# Patient Record
Sex: Female | Born: 1975 | Hispanic: No | Marital: Married
Health system: Midwestern US, Community
[De-identification: ages and names within clinical notes are randomized; demographics above are authoritative.]

---

## 2011-09-25 IMAGING — CR DG ABDOMEN 2V
3 series · 3 of 3 positions shown · non-contrast
Comparison: None

CLINICAL DATA: Abdominal pain, diarrhea, nausea, vomiting, fever

ABDOMEN - 2 VIEW

[w abdomen upright]
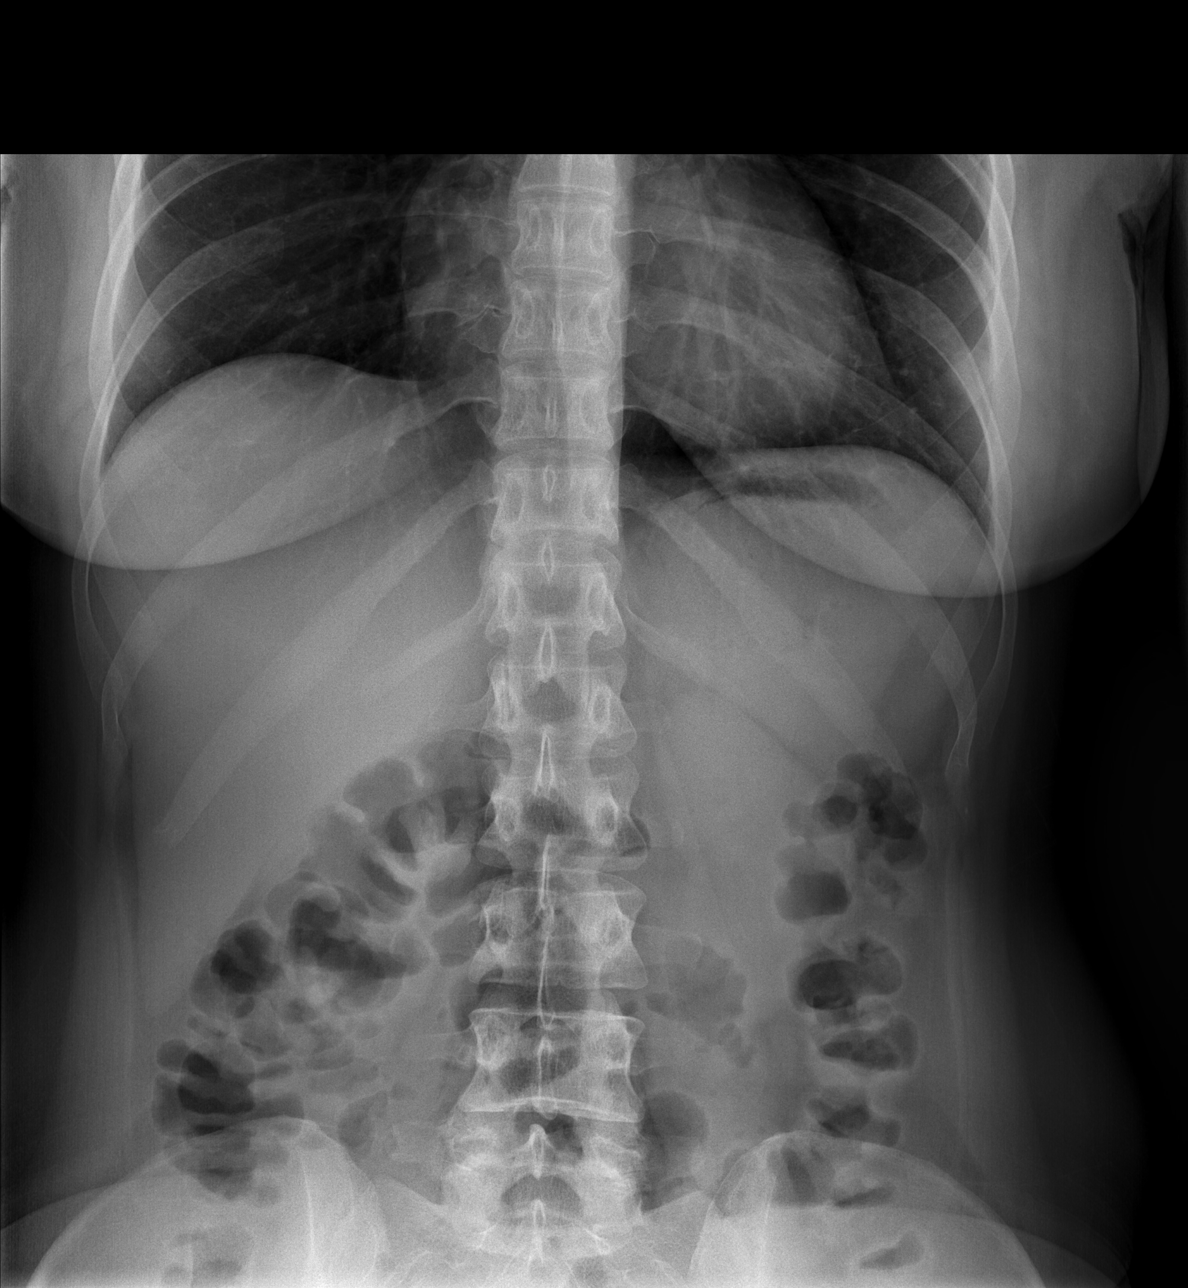

[t abdomen supine (1 of 2)]
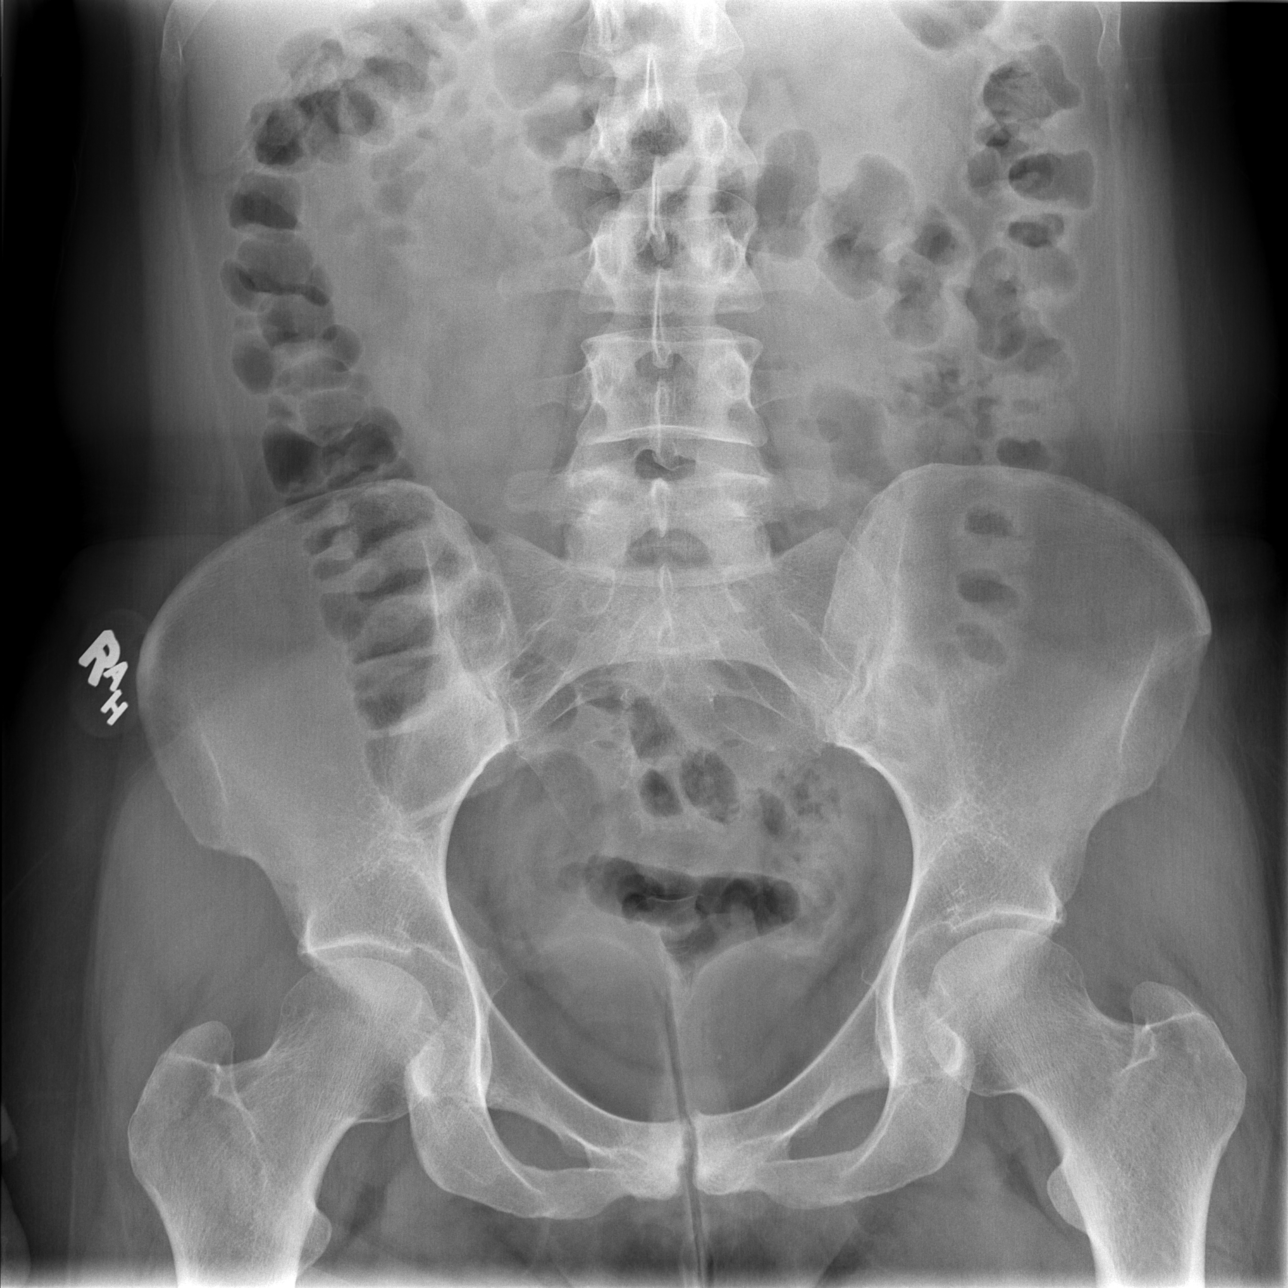

[t abdomen supine (2 of 2)]
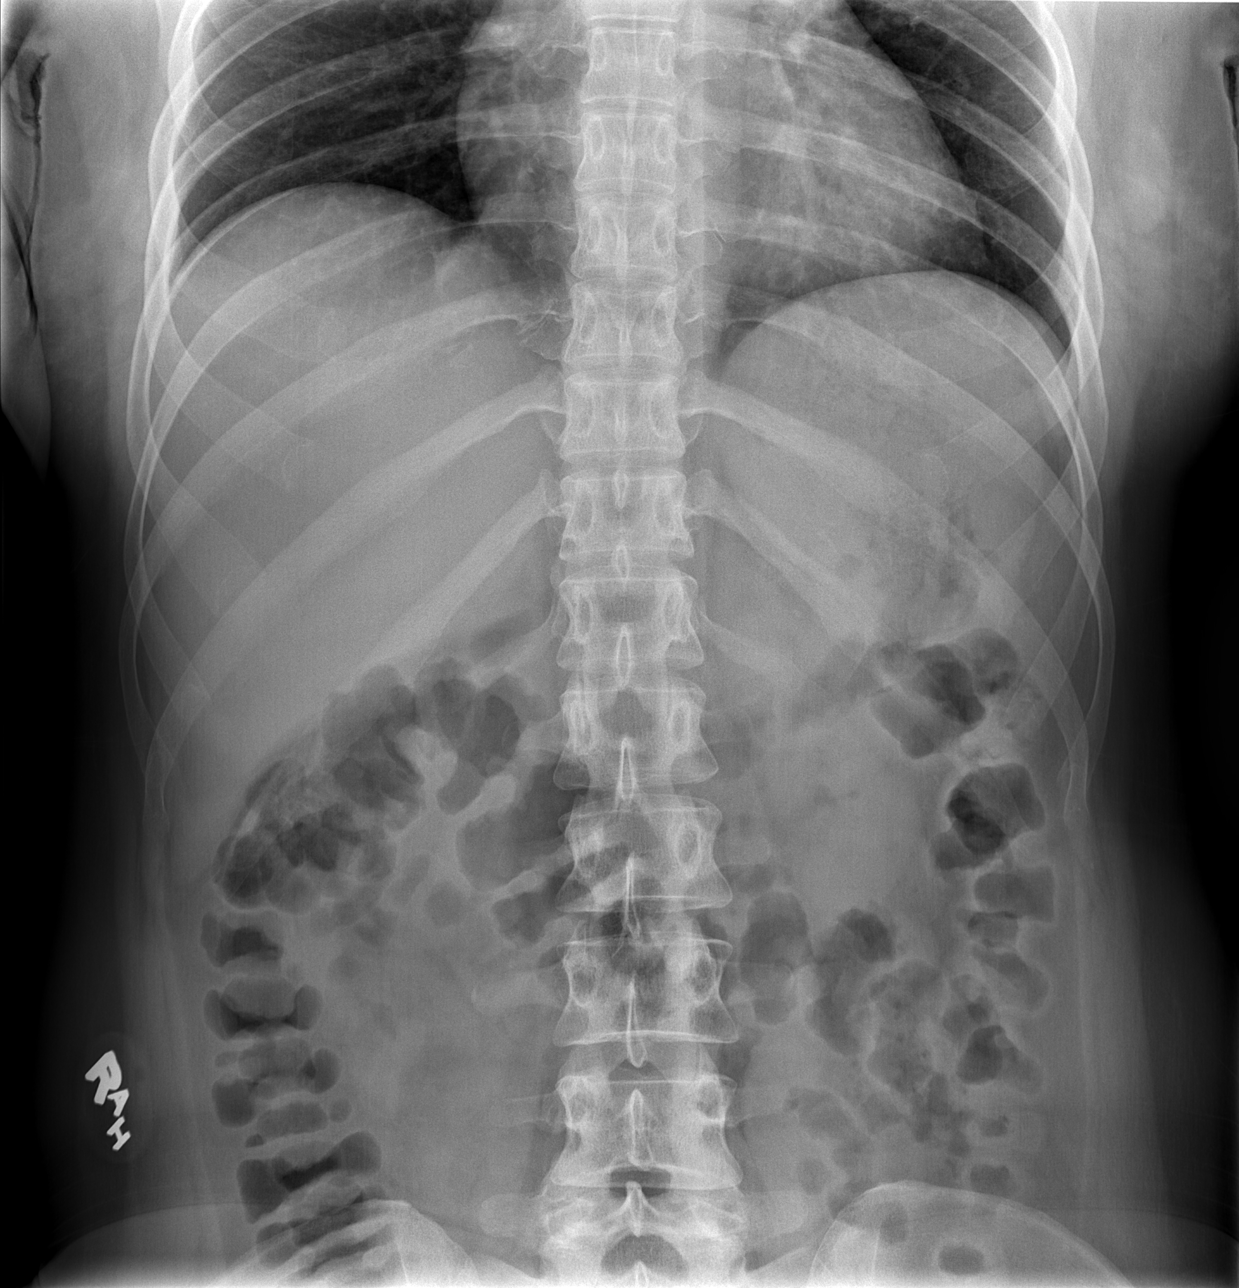

[3 of 3 positions shown; findings below may reference images not displayed]

FINDINGS: Normal bowel gas pattern.
No bowel dilatation or bowel wall thickening.
No free intraperitoneal air.
Lung bases clear.
Osseous structures unremarkable.
No urinary tract calcification.
IMPRESSION: No acute abnormalities.

## 2017-09-06 ENCOUNTER — Emergency Department: Admit: 2017-09-06 | Payer: TRICARE (CHAMPUS) | Primary: Internal Medicine

## 2017-09-06 ENCOUNTER — Inpatient Hospital Stay
Admit: 2017-09-06 | Discharge: 2017-09-07 | Disposition: A | Discharge status: Home / Self Care | Payer: TRICARE (CHAMPUS) | Attending: Emergency Medicine

## 2017-09-06 DIAGNOSIS — B159 Hepatitis A without hepatic coma: Secondary | ICD-10-CM

## 2017-09-06 LAB — EKG 12-LEAD
Atrial Rate: 85 {beats}/min
P Axis: 7 degrees
P-R Interval: 110 ms
Q-T Interval: 366 ms
QRS Duration: 88 ms
QTc Calculation (Bazett): 435 ms
R Axis: 7 degrees
T Axis: 17 degrees
Ventricular Rate: 85 {beats}/min

## 2017-09-06 LAB — METABOLIC PANEL, COMPREHENSIVE
ALT (SGPT): 5641 U/L — ABNORMAL HIGH (ref 12–78)
AST (SGOT): 4079 U/L — ABNORMAL HIGH (ref 15–37)
Albumin: 3.6 gm/dl (ref 3.4–5.0)
Alk. phosphatase: 314 U/L — ABNORMAL HIGH (ref 45–117)
Anion gap: 6 mmol/L (ref 5–15)
BUN: 10 mg/dl (ref 7–25)
Bilirubin, total: 3.6 mg/dl — ABNORMAL HIGH (ref 0.2–1.0)
CO2: 28 mEq/L (ref 21–32)
Calcium: 9.3 mg/dl (ref 8.5–10.1)
Chloride: 105 mEq/L (ref 98–107)
Creatinine: 0.8 mg/dl (ref 0.6–1.3)
GFR est AA: >60
GFR est non-AA: >60
Glucose: 87 mg/dl (ref 74–106)
Potassium: 4 mEq/L (ref 3.5–5.1)
Protein, total: 8 gm/dl (ref 6.4–8.2)
Sodium: 139 mEq/L (ref 136–145)

## 2017-09-06 LAB — HEPATITIS PANEL, ACUTE
Hepatitis A, IgM: REACTIVE — AB
Hepatitis B core, IgM: NONREACTIVE
Hepatitis B surface Ag: NONREACTIVE
Hepatitis C virus Ab: NONREACTIVE
Signal to Cutoff (Hep C): 0

## 2017-09-06 LAB — EKG, 12 LEAD, INITIAL
Atrial Rate: 85 {beats}/min
Calculated P Axis: 7 degrees
Calculated R Axis: 7 degrees
Calculated T Axis: 17 degrees
P-R Interval: 110 ms
Q-T Interval: 366 ms
QRS Duration: 88 ms
QTC Calculation (Bezet): 435 ms
Ventricular Rate: 85 {beats}/min

## 2017-09-06 LAB — URINALYSIS W/ RFLX MICROSCOPIC
Glucose: 100 mg/dl — AB
Nitrites: POSITIVE — AB
Protein: 100 mg/dl — AB
Specific gravity: 1.02 (ref 1.005–1.030)
Urobilinogen: 2 mg/dl — ABNORMAL HIGH (ref 0.0–1.0)
pH (UA): 6.5 (ref 5.0–9.0)

## 2017-09-06 LAB — POC URINE MICROSCOPIC: Epithelial cells, squamous: >50 /LPF

## 2017-09-06 LAB — CBC WITH AUTOMATED DIFF
ATYPICAL LYMPHS: 7.5 % — ABNORMAL HIGH (ref 0–0)
BAND NEUTROPHILS: 1.9 % (ref 0–11)
BASOPHILS: 0 % (ref 0–3)
EOSINOPHILS: 2.8 % (ref 0–5)
HCT: 41.8 % (ref 37.0–50.0)
HGB: 13.3 gm/dl (ref 13.0–17.2)
IMMATURE GRANULOCYTES: 0.5 % (ref 0.0–3.0)
LYMPHOCYTES: 37.7 % (ref 28–48)
MCH: 29.8 pg (ref 25.4–34.6)
MCHC: 31.8 gm/dl (ref 30.0–36.0)
MCV: 93.7 fL (ref 80.0–98.0)
MONOCYTES: 7.6 % (ref 1–13)
MPV: 10.6 fL — ABNORMAL HIGH (ref 6.0–10.0)
NEUTROPHILS: 38.7 % (ref 34–64)
NRBC: 0 (ref 0–0)
PLASMA CELL: 4
PLATELET COMMENTS: NORMAL
PLATELET: 154 10*3/uL (ref 140–450)
RBC: 4.46 M/uL (ref 3.60–5.20)
RDW-SD: 43.3 (ref 36.4–46.3)
Smudge cells: 1.9
WBC: 3.7 10*3/uL — ABNORMAL LOW (ref 4.0–11.0)

## 2017-09-06 LAB — MONONUCLEOSIS SCREEN: Mononucleosis screen: NEGATIVE

## 2017-09-06 LAB — LIPASE: Lipase: 165 U/L (ref 73–393)

## 2017-09-06 LAB — HCG URINE, QL: HCG urine, QL: NEGATIVE

## 2017-09-06 MED ORDER — SODIUM CHLORIDE 0.9% BOLUS IV
0.9 % | INTRAVENOUS | Status: AC
Start: 2017-09-06 — End: 2017-09-06
  Administered 2017-09-06: 20:00:00 via INTRAVENOUS

## 2017-09-06 MED ORDER — ONDANSETRON (PF) 4 MG/2 ML INJECTION
4 mg/2 mL | Freq: Once | INTRAMUSCULAR | Status: AC
Start: 2017-09-06 — End: 2017-09-06
  Administered 2017-09-06: 22:00:00 via INTRAVENOUS

## 2017-09-06 MED FILL — ONDANSETRON (PF) 4 MG/2 ML INJECTION: 4 mg/2 mL | INTRAMUSCULAR | Qty: 2

## 2017-09-06 NOTE — ED Provider Notes (Signed)
[facility]  Emergency Department Treatment Report        Patient: Alyssa Lowe Age: 42 y.o. Sex: female    Date of Birth: 13-Jun-1975 Admit Date: 09/06/2017 PCP: Lacey Jensen, MD   MRN: [id number]  CSN: [id number]  Attending: Maylon Cos, MD   Room: [phone] Time Dictated: 3:55 PM [id number]       Chief Complaint   Fatigue, fever vomiting and diarrhea    History of Present Illness   42 y.o. female states that she started feeling ill with fatigue and fever 6 days ago.  She had gone to an urgent care facility and had a negative strep test and negative flu swab.  She been having some frontal headache which preceded the fever and started about 10 days ago.  The headache was bilateral behind her eyes and since she had a fever she was placed on Augmentin for possible sinus infection.  She states she continued running fever the following day and then started having some nausea vomiting and diarrhea which she thought was related to the antibiotics.  She stopped the Augmentin 3 days ago but she still having nausea and vomiting and she was feeling worse.  States that she vomited 3 times yesterday as well as vomited the day before but none today.  Her urine is very dark and she continues to have generalized fatigue and that is why she came in today.  She admits to some fatty food intolerance that is been going on for a while but denies any abdominal pain.  She is no longer having the headache.    Review of Systems   Review of Systems   Constitutional: Positive for chills, fever and malaise/fatigue.   HENT: Negative for congestion and sore throat.    Eyes: Negative for discharge and redness.   Respiratory: Negative for cough and wheezing.    Cardiovascular: Negative for chest pain and leg swelling.   Gastrointestinal: Positive for diarrhea, nausea and vomiting. Negative for abdominal pain.   Genitourinary: Negative for dysuria.        Dark urine   Musculoskeletal:        Slight low backache    Skin: Negative for rash.   Neurological: Negative for focal weakness and loss of consciousness.       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  Had elevation in LFTs at age 55 after an intentional Tylenol overdose but was admitted and treated for the overdose.  -Hypothyroidism  No current depression suicidal ideation    Social History   Occasional EtOH on weekends but denies frequent EtOH    Denies taking frequent Tylenol.  States she is just taken to Tylenol over the last few days for the fever    Denies recent travel, not eating any raw seafood or drinking from any streams  Family History   No family history of biliary disease    Current Medications     None       Allergies   No Known Allergies    Physical Exam     ED Triage Vitals [09/06/17 1437]   ED Encounter Vitals Group      BP 110/80      Pulse (Heart Rate) 91      Resp Rate 16      Temp 98.2 ??F (36.8 ??C)      Temp src       O2 Sat (%) 98 %      Weight  169 lb 15.6 oz      Height '5\' 8"'      Physical Exam   Constitutional: She is oriented to person, place, and time.   General appearance: Patient appears well-developed and well-nourished.   HENT:   Head: Normocephalic and atraumatic.   Mouth/Throat: Oropharynx is clear and moist.   Eyes: Pupils are equal, round, and reactive to light. Conjunctivae are normal. No scleral icterus.   Neck: Neck supple. No thyromegaly present.   Cardiovascular: Normal rate, regular rhythm and normal heart sounds.   No murmur heard.  Pulmonary/Chest: Effort normal. No respiratory distress. She has no wheezes.   Abdominal: Soft.   Tenderness right upper quadrant, no guarding or rebound, no masses palpable   Musculoskeletal: She exhibits no edema.   Lymphadenopathy:     She has no cervical adenopathy.   Neurological: She is alert and oriented to person, place, and time.   Skin: Skin is warm and dry. No rash noted.   Vitals reviewed.       Impression and Management Plan    Patient with symptoms of fatigue vomiting and diarrhea we will obtain labs , she is tender right upper quadrant we will obtain ultrasound to evaluate for biliary etiology of her symptoms especially with her dark urine.    Diagnostic Studies   Lab:   Recent Results (from the past 12 hour(s))   EKG, 12 LEAD, INITIAL    Collection Time: 09/06/17  2:26 PM   Result Value Ref Range    Ventricular Rate 85 BPM    Atrial Rate 85 BPM    P-R Interval 110 ms    QRS Duration 88 ms    Q-T Interval 366 ms    QTC Calculation (Bezet) 435 ms    Calculated P Axis 7 degrees    Calculated R Axis 7 degrees    Calculated T Axis 17 degrees    Diagnosis       Sinus rhythm with short PR  Low voltage QRS  Nonspecific ST abnormality  Abnormal ECG  No previous ECGs available  Confirmed by Truman Hayward, M.D., [facility] ([id number]) on 09/06/2017 7:40:16 PM     CBC WITH AUTOMATED DIFF    Collection Time: 09/06/17  3:00 PM   Result Value Ref Range    WBC 3.7 (L) 4.0 - 11.0 1000/mm3    NEUTROPHILS 38.7 34 - 64 %    LYMPHOCYTES 37.7 28 - 48 %    RBC 4.46 3.60 - 5.20 M/uL    MONOCYTES 7.6 1 - 13 %    HGB 13.3 13.0 - 17.2 gm/dl    EOSINOPHILS 2.8 0 - 5 %    HCT 41.8 37.0 - 50.0 %    MCV 93.7 80.0 - 98.0 fL    BAND NEUTROPHILS 1.9 0 - 11 %    MCH 29.8 25.4 - 34.6 pg    MCHC 31.8 30.0 - 36.0 gm/dl    PLATELET 154 140 - 450 1000/mm3    MPV 10.6 (H) 6.0 - 10.0 fL    ATYPICAL LYMPHS 7.5 (H) 0 - 0 %    RDW-SD 43.3 36.4 - 46.3      NRBC 0 0 - 0      IMMATURE GRANULOCYTES 0.5 0.0 - 3.0 %    BASOPHILS 0.0 0 - 3 %    Smudge cells 1.9      PLASMA CELL 4      Poikilocytosis OCCASIONAL      PLATELET COMMENTS NORMAL  Large platelets OCCASIONAL      Ovalocytes-DIF OCCASIONAL     LIPASE    Collection Time: 09/06/17  3:00 PM   Result Value Ref Range    Lipase 165 73 - 712 U/L   METABOLIC PANEL, COMPREHENSIVE    Collection Time: 09/06/17  3:00 PM   Result Value Ref Range    Sodium 139 136 - 145 mEq/L    Potassium 4.0 3.5 - 5.1 mEq/L    Chloride 105 98 - 107 mEq/L     CO2 28 21 - 32 mEq/L    Glucose 87 74 - 106 mg/dl    BUN 10 7 - 25 mg/dl    Creatinine 0.8 0.6 - 1.3 mg/dl    GFR est AA >60.0      GFR est non-AA >60      Calcium 9.3 8.5 - 10.1 mg/dl    AST (SGOT) 4,079 (H) 15 - 37 U/L    ALT (SGPT) 5,641 (H) 12 - 78 U/L    Alk. phosphatase 314 (H) 45 - 117 U/L    Bilirubin, total 3.6 (H) 0.2 - 1.0 mg/dl    Protein, total 8.0 6.4 - 8.2 gm/dl    Albumin 3.6 3.4 - 5.0 gm/dl    Anion gap 6 5 - 15 mmol/L   MONONUCLEOSIS SCREEN    Collection Time: 09/06/17  4:15 PM   Result Value Ref Range    Mononucleosis screen NEGATIVE NEGATIVE     HEPATITIS PANEL, ACUTE    Collection Time: 09/06/17  4:15 PM   Result Value Ref Range    Hepatitis A, IgM REACTIVE (A) NON-REACTIVE      Hepatitis B core, IgM NON-REACTIVE      Hepatitis B surface Ag NON-REACTIVE NON-REACTIVE      Hepatitis C virus Ab NON-REACTIVE NON-REACTIVE      Signal to Cutoff (Hep C) 0     URINALYSIS W/ RFLX MICROSCOPIC    Collection Time: 09/06/17  4:35 PM   Result Value Ref Range    Color AMBER (A) YELLOW,STRAW      Appearance SLIGHTLY CLOUDY (A) CLEAR      Glucose 100 (A) NEGATIVE,Negative mg/dl    Bilirubin LARGE (A) NEGATIVE,Negative      Ketone TRACE (A) NEGATIVE,Negative mg/dl    Specific gravity 1.020 1.005 - 1.030      Blood TRACE (A) NEGATIVE,Negative      pH (UA) 6.5 5.0 - 9.0      Protein 100 (A) NEGATIVE,Negative mg/dl    Urobilinogen 2.0 (H) 0.0 - 1.0 mg/dl    Nitrites POSITIVE (A) NEGATIVE,Negative      Leukocyte Esterase TRACE (A) NEGATIVE,Negative     POC URINE MICROSCOPIC    Collection Time: 09/06/17  4:35 PM   Result Value Ref Range    Epithelial cells, squamous >50 /LPF    WBC 1-4 /HPF    Bacteria 1+ /HPF    CRYSTALS, CALCIUM OXALATE OCCASIONAL /HPF   HCG URINE, QL    Collection Time: 09/06/17  4:45 PM   Result Value Ref Range    HCG urine, QL NEGATIVE NEGATIVE     PROTHROMBIN TIME + INR    Collection Time: 09/06/17  8:30 PM   Result Value Ref Range    Prothrombin time 13.5 (H) 10.2 - 12.9 seconds     INR 1.2 (H) 0.1 - 1.1       Labs Reviewed   CBC WITH AUTOMATED DIFF - Abnormal; Notable for the following components:  Result Value    WBC 3.7 (*)     MPV 10.6 (*)     ATYPICAL LYMPHS 7.5 (*)     All other components within normal limits   METABOLIC PANEL, COMPREHENSIVE - Abnormal; Notable for the following components:    AST (SGOT) 4,079 (*)     ALT (SGPT) 5,641 (*)     Alk. phosphatase 314 (*)     Bilirubin, total 3.6 (*)     All other components within normal limits   HEPATITIS PANEL, ACUTE - Abnormal; Notable for the following components:    Hepatitis A, IgM REACTIVE (*)     All other components within normal limits   URINALYSIS W/ RFLX MICROSCOPIC - Abnormal; Notable for the following components:    Color AMBER (*)     Appearance SLIGHTLY CLOUDY (*)     Glucose 100 (*)     Bilirubin LARGE (*)     Ketone TRACE (*)     Blood TRACE (*)     Protein 100 (*)     Urobilinogen 2.0 (*)     Nitrites POSITIVE (*)     Leukocyte Esterase TRACE (*)     All other components within normal limits   PROTHROMBIN TIME + INR - Abnormal; Notable for the following components:    Prothrombin time 13.5 (*)     INR 1.2 (*)     All other components within normal limits   CULTURE, BLOOD   CULTURE, BLOOD   CULTURE, URINE   C. DIFFICILE/EPI PCR   CULTURE, STOOL   OVA & PARASITES, STOOL   LIPASE   MONONUCLEOSIS SCREEN   HCG URINE, QL   POC URINE MICROSCOPIC   EKG per ED attending sinus rhythm at a rate of 85 with no ST elevation and some nonspecific ST-T changes per ED attending, no acute ischemic changes.    Imaging:    Xr Chest Pa Lat    Result Date: 09/06/2017  EXAM: Frontal and lateral views of the chest. INDICATIONS: Fever. COMPARISON: None. FINDINGS: No alveolar consolidations, congestive changes or pleural effusions. Cardiomediastinal silhouette normal in size and contour.     IMPRESSION: No acute cardiopulmonary process.     [place] Ruq    Result Date: 09/06/2017  Examination: Ultrasound right upper quadrant. INDICATION: Right upper  quadrant pain. Elevated LFTs. FINDINGS: Liver appears unremarkable. Normal hepatic parenchyma. No focal hepatic lesions. No significant intrahepatic biliary ductal dilatation. CBD within normal limits roughly measuring 0.4 cm. The gallbladder is contracted. There is mild irregular thickening of the gallbladder wall. No significant surrounding inflammatory change. Portions of the gallbladder wall measure up to 0.7 cm in thickness. There is evidence of sonographic Murphy's sign per sonographer. Visualized pancreas grossly unremarkable. Proximal aorta and IVC grossly unremarkable. The right kidney is normal in size and appears unremarkable.     IMPRESSION: 1. The gallbladder is contracted, demonstrating some irregular wall thickening. The thickening of the gallbladder wall may be entirely related to decompression of the gallbladder. No stone evidence of flow within the gallbladder wall to suggest a mass. Recommend clinical correlation. May consider HIDA scan, particularly in the setting of positive sonographic Murphy's sign per sonographer. Alternatively, recommend follow-up ultrasound in 2 weeks for comparison. 2. Otherwise, unremarkable exam.       ED Course     Medications   piperacillin-tazobactam (ZOSYN) 3.375 g in 0.9% sodium chloride (MBP/ADV) 100 mL MBP (has no administration in time range)   ondansetron (ZOFRAN) injection 4 mg (has  no administration in time range)   0.9% sodium chloride infusion (has no administration in time range)   sodium chloride 0.9 % bolus infusion 1,000 mL (0 mL IntraVENous IV Completed 09/06/17 2105)   ondansetron (ZOFRAN) injection 4 mg (4 mg IntraVENous Given 09/06/17 1817)     ED Course as of Sep 06 2104   Wed Sep 06, 2017   1617 Patient's LFTs are elevated but lipase normal.  Concerns for possible hepatitis, with elevation and atypical lymphocytes consider mono however significantly elevated LFTs.  We will attempt to obtain stool sample if able.    [DH]      ED Course User Index   [DH] Earline Mayotte, PA     Medical Decision Making   Call to Dr. Eugenia Pancoast and they will consult.  Patient admitted to the hospitalist.  I did speak to the patient about contacting her children's pediatrician with regards to vaccine and prophylaxis  Final Diagnosis       ICD-10-CM ICD-9-CM   1. Viral hepatitis A without hepatic coma B15.9 070.1       Disposition   Admitted to the hospitalist with GI to consult  There are no discharge medications for this patient.      The patient was personally evaluated by myself and Dr. Emeterio Reeve, Jeanine Luz, MD who agrees with the above assessment and plan.    Benay Pike, PA-C  Sep 06, 2017    My signature above authenticates this document and my orders, the final ??  diagnosis (es), discharge prescription (s), and instructions in the Epic ??  record.  If you have any questions please contact [phone].  ??  Nursing notes have been reviewed by the physician/ advanced practice ??  Clinician.    Dragon medical dictation software was used for portions of this report. Unintended voice recognition errors may occur.

## 2017-09-06 NOTE — H&P (Signed)
Medicine History and Physical    Patient: Alyssa Lowe Age: 42 y.o. Sex: female    Date of Birth: [birth year] Admit Date: 09/06/2017 PCP: Lacey Jensen, MD   MRN: [id number]  CSN: [id number]         Assessment   Acute Hepatitis A  UTI   Elevated LFT, due to above     Plan   IVF  Trend LFT  Continue ABX  Follow urine CS  GI consulted   Zosyn IV  Contact precaution  Check INR  Diet Regular   DVT PPX Heparin sq  ACP: CODE STATUS FULL CODE       Chief Complaint:  Chief Complaint   Patient presents with   ??? Diarrhea   ??? Fever   ??? Vomiting   ??? Chest Pain         HPI:   Alyssa Lowe is a 42 y.o. year old female who presents with    headache lastingseveral days. Last Thursday suddenly felt very tired and had a fever. Went to her PCP Thursday. Was tested for strep and flu with negative results. She has been taking ABX for sinus infection and reports fever of 103, feeling worse overall, diarrhea, sweating, dizziness, nausea. Past two days has been vomiting along with blurry vision. Stopped taking her antibiotic Sunday "augmentin".      RUQ US shows: 1. The gallbladder is contracted, demonstrating some irregular wall thickening.  The thickening of the gallbladder wall may be entirely related to decompression  of the gallbladder. No stone evidence of flow within the gallbladder wall to  suggest a mass. Recommend clinical correlation. May consider HIDA scan,  particularly in the setting of positive sonographic Murphy's sign per  sonographer. Alternatively, recommend follow-up ultrasound in 2 weeks for  comparison.  2. Otherwise, unremarkable exam.      Review of Systems - 12 Point ROS -ve except what is noted in the HPI.     Past Medical History:  No past medical history on file.    Past Surgical History:  No past surgical history on file.    Family History:  No family history on file.    Social History:  Social History     Socioeconomic History   ??? Marital status: MARRIED     Spouse name: Not on file    ??? Number of children: Not on file   ??? Years of education: Not on file   ??? Highest education level: Not on file       Home Medications:  Prior to Admission medications    Not on File       Allergies:  No Known Allergies      Physical Exam:     Visit Vitals  BP 120/76 (BP 1 Location: Left arm, BP Patient Position: Sitting)   Pulse 79   Temp 98.2 ??F (36.8 ??C)   Resp 20   Ht '5\' 8"'  (1.727 m)   Wt 77.1 kg (169 lb 15.6 oz)   SpO2 100%   BMI 25.84 kg/m??       Physical Exam:  General appearance: alert, cooperative, no distress, appears stated age  Head: Normocephalic, without obvious abnormality, atraumatic  Neck: supple, trachea midline  Lungs: clear to auscultation bilaterally  Heart: regular rate and rhythm, S1, S2 normal, no murmur, click, rub or gallop  Abdomen: soft, moderate tenderness to mild palpation of RUQ. Bowel sounds normal. No masses,  no organomegaly  Extremities: extremities normal, atraumatic, no cyanosis or edema  Skin: jaundiced mild, dry skin, dry MM  Neurologic: Grossly normal    Intake and Output:  Current Shift:  No intake/output data recorded.  Last three shifts:  No intake/output data recorded.    Lab/Data Reviewed:  Lab:   Recent Results (from the past 12 hour(s))   EKG, 12 LEAD, INITIAL    Collection Time: 09/06/17  2:26 PM   Result Value Ref Range    Ventricular Rate 85 BPM    Atrial Rate 85 BPM    P-R Interval 110 ms    QRS Duration 88 ms    Q-T Interval 366 ms    QTC Calculation (Bezet) 435 ms    Calculated P Axis 7 degrees    Calculated R Axis 7 degrees    Calculated T Axis 17 degrees    Diagnosis       Sinus rhythm with short PR  Low voltage QRS  Nonspecific ST abnormality  Abnormal ECG  No previous ECGs available  Confirmed by Truman Hayward, M.D., [facility] ([id number]) on 09/06/2017 7:40:16 PM     CBC WITH AUTOMATED DIFF    Collection Time: 09/06/17  3:00 PM   Result Value Ref Range    WBC 3.7 (L) 4.0 - 11.0 1000/mm3    NEUTROPHILS 38.7 34 - 64 %    LYMPHOCYTES 37.7 28 - 48 %    RBC 4.46 3.60 - 5.20 M/uL     MONOCYTES 7.6 1 - 13 %    HGB 13.3 13.0 - 17.2 gm/dl    EOSINOPHILS 2.8 0 - 5 %    HCT 41.8 37.0 - 50.0 %    MCV 93.7 80.0 - 98.0 fL    BAND NEUTROPHILS 1.9 0 - 11 %    MCH 29.8 25.4 - 34.6 pg    MCHC 31.8 30.0 - 36.0 gm/dl    PLATELET 154 140 - 450 1000/mm3    MPV 10.6 (H) 6.0 - 10.0 fL    ATYPICAL LYMPHS 7.5 (H) 0 - 0 %    RDW-SD 43.3 36.4 - 46.3      NRBC 0 0 - 0      IMMATURE GRANULOCYTES 0.5 0.0 - 3.0 %    BASOPHILS 0.0 0 - 3 %    Smudge cells 1.9      PLASMA CELL 4      Poikilocytosis OCCASIONAL      PLATELET COMMENTS NORMAL      Large platelets OCCASIONAL      Ovalocytes-DIF OCCASIONAL     LIPASE    Collection Time: 09/06/17  3:00 PM   Result Value Ref Range    Lipase 165 73 - 536 U/L   METABOLIC PANEL, COMPREHENSIVE    Collection Time: 09/06/17  3:00 PM   Result Value Ref Range    Sodium 139 136 - 145 mEq/L    Potassium 4.0 3.5 - 5.1 mEq/L    Chloride 105 98 - 107 mEq/L    CO2 28 21 - 32 mEq/L    Glucose 87 74 - 106 mg/dl    BUN 10 7 - 25 mg/dl    Creatinine 0.8 0.6 - 1.3 mg/dl    GFR est AA >60.0      GFR est non-AA >60      Calcium 9.3 8.5 - 10.1 mg/dl    AST (SGOT) 4,079 (H) 15 - 37 U/L    ALT (SGPT) 5,641 (H) 12 - 78 U/L    Alk. phosphatase 314 (H) 45 - 117 U/L  Bilirubin, total 3.6 (H) 0.2 - 1.0 mg/dl    Protein, total 8.0 6.4 - 8.2 gm/dl    Albumin 3.6 3.4 - 5.0 gm/dl    Anion gap 6 5 - 15 mmol/L   MONONUCLEOSIS SCREEN    Collection Time: 09/06/17  4:15 PM   Result Value Ref Range    Mononucleosis screen NEGATIVE NEGATIVE     HEPATITIS PANEL, ACUTE    Collection Time: 09/06/17  4:15 PM   Result Value Ref Range    Hepatitis A, IgM REACTIVE (A) NON-REACTIVE      Hepatitis B core, IgM NON-REACTIVE      Hepatitis B surface Ag NON-REACTIVE NON-REACTIVE      Hepatitis C virus Ab NON-REACTIVE NON-REACTIVE      Signal to Cutoff (Hep C) 0     URINALYSIS W/ RFLX MICROSCOPIC    Collection Time: 09/06/17  4:35 PM   Result Value Ref Range    Color AMBER (A) YELLOW,STRAW       Appearance SLIGHTLY CLOUDY (A) CLEAR      Glucose 100 (A) NEGATIVE,Negative mg/dl    Bilirubin LARGE (A) NEGATIVE,Negative      Ketone TRACE (A) NEGATIVE,Negative mg/dl    Specific gravity 1.020 1.005 - 1.030      Blood TRACE (A) NEGATIVE,Negative      pH (UA) 6.5 5.0 - 9.0      Protein 100 (A) NEGATIVE,Negative mg/dl    Urobilinogen 2.0 (H) 0.0 - 1.0 mg/dl    Nitrites POSITIVE (A) NEGATIVE,Negative      Leukocyte Esterase TRACE (A) NEGATIVE,Negative     POC URINE MICROSCOPIC    Collection Time: 09/06/17  4:35 PM   Result Value Ref Range    Epithelial cells, squamous >50 /LPF    WBC 1-4 /HPF    Bacteria 1+ /HPF    CRYSTALS, CALCIUM OXALATE OCCASIONAL /HPF   HCG URINE, QL    Collection Time: 09/06/17  4:45 PM   Result Value Ref Range    HCG urine, QL NEGATIVE NEGATIVE         Imaging:    Xr Chest Pa Lat    Result Date: 09/06/2017  EXAM: Frontal and lateral views of the chest. INDICATIONS: Fever. COMPARISON: None. FINDINGS: No alveolar consolidations, congestive changes or pleural effusions. Cardiomediastinal silhouette normal in size and contour.     IMPRESSION: No acute cardiopulmonary process.     [place] Ruq    Result Date: 09/06/2017  Examination: Ultrasound right upper quadrant. INDICATION: Right upper quadrant pain. Elevated LFTs. FINDINGS: Liver appears unremarkable. Normal hepatic parenchyma. No focal hepatic lesions. No significant intrahepatic biliary ductal dilatation. CBD within normal limits roughly measuring 0.4 cm. The gallbladder is contracted. There is mild irregular thickening of the gallbladder wall. No significant surrounding inflammatory change. Portions of the gallbladder wall measure up to 0.7 cm in thickness. There is evidence of sonographic Murphy's sign per sonographer. Visualized pancreas grossly unremarkable. Proximal aorta and IVC grossly unremarkable. The right kidney is normal in size and appears unremarkable.     IMPRESSION: 1. The gallbladder is contracted, demonstrating some irregular  wall thickening. The thickening of the gallbladder wall may be entirely related to decompression of the gallbladder. No stone evidence of flow within the gallbladder wall to suggest a mass. Recommend clinical correlation. May consider HIDA scan, particularly in the setting of positive sonographic Murphy's sign per sonographer. Alternatively, recommend follow-up ultrasound in 2 weeks for comparison. 2. Otherwise, unremarkable exam.       Karsen Fellows  Dawson Bills, MD  Sep 06, 2017

## 2017-09-06 NOTE — ED Triage Notes (Signed)
Pt. Reports a week ago Sunday had a migraine. Headache lasted several days. Last Thursday suddenly felt very tired and had a fever. Went to her PCP Thursday. Was tested for strep and flu, negative results. Taking antibiotic now for sinus infection. Next day fever 103, feeling worse, diarrhea, sweating, dizziness, nausea. Past two days has been vomiting. Blurry vision, chest discomfort with deep breaths and coughing. Stopped taking her antibiotic Sunday.  "augmentin" .

## 2017-09-06 NOTE — Progress Notes (Addendum)
PAGER ID: [phone]   MESSAGE: [id number] Alyssa Lowe, Alyssa Lowe 42 y.o. F Hep A. Pt requesting melatonin for sleep. Thanks! 5E [name]    [id number]  PAGER ID: [phone]   MESSAGE: Dr. Shela Commons, for [address] [place] 42 y.o. F Hep A. Can she get melatonin for sleep? 5E [name]

## 2017-09-06 NOTE — Other (Signed)
TRANSFER - OUT REPORT:    Verbal report given to RN (name) on [facility]  being transferred to [facility] (unit) for routine progression of care       Report consisted of patient???s Situation, Background, Assessment and   Recommendations(SBAR).     Information from the following report(s) SBAR and Kardex was reviewed with the receiving nurse.    Lines:   Peripheral IV 09/06/17 Right Antecubital (Active)   Site Assessment Clean, dry, & intact 09/06/2017  3:10 PM   Phlebitis Assessment 0 09/06/2017  3:10 PM   Infiltration Assessment 0 09/06/2017  3:10 PM   Dressing Status Clean, dry, & intact 09/06/2017  3:10 PM   Dressing Type Transparent 09/06/2017  3:10 PM   Hub Color/Line Status Flushed 09/06/2017  3:10 PM   Alcohol Cap Used Yes 09/06/2017  3:10 PM       Peripheral IV 09/06/17 Right Antecubital (Active)        Opportunity for questions and clarification was provided.      Patient transported with:   [employer]

## 2017-09-07 LAB — METABOLIC PANEL, COMPREHENSIVE
ALT (SGPT): 4622 U/L — ABNORMAL HIGH (ref 12–78)
AST (SGOT): 3046 U/L — ABNORMAL HIGH (ref 15–37)
Albumin: 2.9 gm/dl — ABNORMAL LOW (ref 3.4–5.0)
Alk. phosphatase: 240 U/L — ABNORMAL HIGH (ref 45–117)
Anion gap: 7 mmol/L (ref 5–15)
BUN: 10 mg/dl (ref 7–25)
Bilirubin, total: 3.7 mg/dl — ABNORMAL HIGH (ref 0.2–1.0)
CO2: 26 mEq/L (ref 21–32)
Calcium: 8.2 mg/dl — ABNORMAL LOW (ref 8.5–10.1)
Chloride: 108 mEq/L — ABNORMAL HIGH (ref 98–107)
Creatinine: 0.6 mg/dl (ref 0.6–1.3)
GFR est AA: >60
GFR est non-AA: >60
Glucose: 92 mg/dl (ref 74–106)
Potassium: 4 mEq/L (ref 3.5–5.1)
Protein, total: 6.1 gm/dl — ABNORMAL LOW (ref 6.4–8.2)
Sodium: 141 mEq/L (ref 136–145)

## 2017-09-07 LAB — PROTHROMBIN TIME + INR
INR: 1.2 — ABNORMAL HIGH (ref 0.1–1.1)
Prothrombin time: 13.5 seconds — ABNORMAL HIGH (ref 10.2–12.9)

## 2017-09-07 LAB — CBC WITH AUTOMATED DIFF
ATYPICAL LYMPHS: 5 % — ABNORMAL HIGH (ref 0–0)
BASOPHILS: 0.6 % (ref 0–3)
EOSINOPHILS: 5 % (ref 0–5)
HCT: 36.4 % — ABNORMAL LOW (ref 37.0–50.0)
HGB: 11.9 gm/dl — ABNORMAL LOW (ref 13.0–17.2)
IMMATURE GRANULOCYTES: 0.3 % (ref 0.0–3.0)
LYMPHOCYTES: 49 % — ABNORMAL HIGH (ref 28–48)
MCH: 31 pg (ref 25.4–34.6)
MCHC: 32.7 gm/dl (ref 30.0–36.0)
MCV: 94.8 fL (ref 80.0–98.0)
MONOCYTES: 7 % (ref 1–13)
MPV: 10.5 fL — ABNORMAL HIGH (ref 6.0–10.0)
NEUTROPHILS: 34 % (ref 34–64)
NRBC: 0 (ref 0–0)
PLATELET COMMENTS: NORMAL
PLATELET: 140 10*3/uL (ref 140–450)
RBC: 3.84 M/uL (ref 3.60–5.20)
RDW-SD: 45 (ref 36.4–46.3)
WBC: 3.6 10*3/uL — ABNORMAL LOW (ref 4.0–11.0)

## 2017-09-07 LAB — GLUCOSE, POC: Glucose (POC): 89 mg/dL (ref 65–105)

## 2017-09-07 MED ORDER — PIPERACILLIN-TAZOBACTAM 3.375 GRAM IV SOLR
3.375 gram | Freq: Three times a day (TID) | INTRAVENOUS | Status: DC
Start: 2017-09-07 — End: 2017-09-07
  Administered 2017-09-07 (×2): via INTRAVENOUS

## 2017-09-07 MED ORDER — ONDANSETRON (PF) 4 MG/2 ML INJECTION
4 mg/2 mL | INTRAMUSCULAR | Status: DC | PRN
Start: 2017-09-07 — End: 2017-09-08
  Administered 2017-09-07: 16:00:00 via INTRAVENOUS

## 2017-09-07 MED ORDER — NALOXONE 0.4 MG/ML INJECTION
0.4 mg/mL | INTRAMUSCULAR | Status: DC | PRN
Start: 2017-09-07 — End: 2017-09-08

## 2017-09-07 MED ORDER — DIPHENHYDRAMINE 25 MG CAP
25 mg | Freq: Once | ORAL | Status: AC | PRN
Start: 2017-09-07 — End: 2017-09-07
  Administered 2017-09-07: 04:00:00 via ORAL

## 2017-09-07 MED ORDER — SODIUM CHLORIDE 0.9 % IJ SYRG
Freq: Three times a day (TID) | INTRAMUSCULAR | Status: DC
Start: 2017-09-07 — End: 2017-09-08
  Administered 2017-09-07 – 2017-09-08 (×4): via INTRAVENOUS

## 2017-09-07 MED ORDER — LEVOTHYROXINE 25 MCG TAB
25 mcg | Freq: Every day | ORAL | Status: DC
Start: 2017-09-07 — End: 2017-09-08
  Administered 2017-09-08: 09:00:00 via ORAL

## 2017-09-07 MED ORDER — SODIUM CHLORIDE 0.9 % IJ SYRG
INTRAMUSCULAR | Status: DC | PRN
Start: 2017-09-07 — End: 2017-09-08

## 2017-09-07 MED ORDER — ONDANSETRON (PF) 4 MG/2 ML INJECTION
4 mg/2 mL | Freq: Four times a day (QID) | INTRAMUSCULAR | Status: DC | PRN
Start: 2017-09-07 — End: 2017-09-07
  Administered 2017-09-07 (×2): via INTRAVENOUS

## 2017-09-07 MED ORDER — ACETAMINOPHEN 325 MG TABLET
325 mg | Freq: Four times a day (QID) | ORAL | Status: DC | PRN
Start: 2017-09-07 — End: 2017-09-08

## 2017-09-07 MED ORDER — SODIUM CHLORIDE 0.9 % IV PIGGY BACK
1 gram | Freq: Two times a day (BID) | INTRAVENOUS | Status: DC
Start: 2017-09-07 — End: 2017-09-08
  Administered 2017-09-07 – 2017-09-08 (×2): via INTRAVENOUS

## 2017-09-07 MED ORDER — HEPARIN (PORCINE) 5,000 UNIT/ML IJ SOLN
5000 unit/mL | Freq: Two times a day (BID) | INTRAMUSCULAR | Status: DC
Start: 2017-09-07 — End: 2017-09-07

## 2017-09-07 MED ORDER — PIPERACILLIN-TAZOBACTAM 3.375 GRAM IV SOLR
3.375 gram | INTRAVENOUS | Status: AC
Start: 2017-09-07 — End: 2017-09-07
  Administered 2017-09-07: 02:00:00 via INTRAVENOUS

## 2017-09-07 MED ORDER — SODIUM CHLORIDE 0.9 % IV
INTRAVENOUS | Status: DC
Start: 2017-09-07 — End: 2017-09-07
  Administered 2017-09-07 (×2): via INTRAVENOUS

## 2017-09-07 MED ORDER — DEXTROSE 5%-1/2 NORMAL SALINE IV
INTRAVENOUS | Status: DC
Start: 2017-09-07 — End: 2017-09-08
  Administered 2017-09-07 – 2017-09-08 (×3): via INTRAVENOUS

## 2017-09-07 MED FILL — PIPERACILLIN-TAZOBACTAM 3.375 GRAM IV SOLR: 3.375 gram | INTRAVENOUS | Qty: 3.38

## 2017-09-07 MED FILL — SODIUM CHLORIDE 0.9 % IV: INTRAVENOUS | Qty: 1000

## 2017-09-07 MED FILL — ONDANSETRON (PF) 4 MG/2 ML INJECTION: 4 mg/2 mL | INTRAMUSCULAR | Qty: 2

## 2017-09-07 MED FILL — LEVOTHYROXINE 25 MCG TAB: 25 mcg | ORAL | Qty: 1

## 2017-09-07 MED FILL — HEPARIN (PORCINE) 5,000 UNIT/ML IJ SOLN: 5000 unit/mL | INTRAMUSCULAR | Qty: 1

## 2017-09-07 MED FILL — DIPHENHYDRAMINE 25 MG CAP: 25 mg | ORAL | Qty: 1

## 2017-09-07 MED FILL — DEXTROSE 5%-1/2 NORMAL SALINE IV: INTRAVENOUS | Qty: 1000

## 2017-09-07 MED FILL — CEFTRIAXONE 1 GRAM SOLUTION FOR INJECTION: 1 gram | INTRAMUSCULAR | Qty: 1

## 2017-09-07 NOTE — Consults (Signed)
Consults by Nelta Numbers, NP at 09/07/17 1050                Author: Nelta Numbers, NP  Service: Gastroenterology  Author Type: Nurse Practitioner       Filed: 09/07/17 1316  Date of Service: 09/07/17 1050  Status: Attested           Editor: Nelta Numbers, NP (Nurse Practitioner)  Cosigner: Reatha Harps, MD at 09/07/17 1755            Consult Orders        1. CONSULT TO PHYSICIAN [[id number]] ordered by Irving Burton, MD at 09/06/17 1935                         Attestation signed by Reatha Harps, MD at 09/07/17 1755                    A member of [facility]         I have independently interviewed and examined the patient. I have reviewed the chart and discussed the case with our nurse practitioner. I agree with her findings and plan as documented in the note.      Acute hepatitis A with no coagulopathy or encephalopathy to suggest fulminant hepatic failure.  Transaminases have peaked.  Still with nausea.  Timing of discharge depends on her ability to take adequate PO fluid/nutrition.  Family members have already  been vaccinated.      Mike Gip, MD   [facility]   [facility] [phone]                                               A member of [facility]      Consult Note          Patient: Alyssa Lowe  Age: 42 y.o.  Sex: female          Date of Birth: 05/10/1975  Admit Date: 09/06/2017  PCP: Sol Blazing, MD     MRN: [id number]   CSN: [id number]                IMPRESSION :        1.  Acute hepatitis A  - INR 1.2, Tbili 3.7.    2.  UTI - POA        RECOMMENDATIONS:        ??  Diet as tolerated.   ??  Hepatic function and PT/INR in am.   ??  Anticipate transaminases will peak and begin to trend down soon with later peak in Tbili.    ??  If INR stable, should be able to go home tomorrow.     ??  Recommend check hepatic function and INR/PT in 5-7days after DC, repeat in 1 week, then monthly until normal.    ??   Recommend patient follow up at [facility] with her PCP in 1 week.    ??  Instructed on proper hand hygiene and to avoid alcohol for next 6-8 weeks.    ??  Safe to use Tylenol as directed (no more than 3 grams in 24 hrs).         HPI:        I was  asked by Dr. Sandie Ano to evaluate [facility] for acute hepatitis A.  She is a 42 y.o. female admitted on 09/06/2017  for Hepatitis A [B15.9]. Patient presented to [facility] for malaise, fever, chills, and nausea x 1 week.  Neurology revealed marked elevation of transaminases and she was admitted for acute hepatitis A.  She does routinely drink  raw shellfish, fish, and commercially purchased fruit/vegetable smoothies.  No recent travel or sick contacts.  She drinks 2-3 alcoholic beverages on the weekends.  She does have tattoos, denies intranasal/IV drug use.        Patient Active Problem List           Diagnosis  Date Noted         ?  Hepatitis A  09/06/2017         No past medical history on file.   No Known Allergies        Prior to Admission Medications     Prescriptions  Last Dose  Informant  Patient Reported?  Taking?      cetirizine (ZYRTEC) 10 mg tablet      Yes  Yes      Sig: Take 10 mg by mouth daily.      docusate sodium (COLACE) 100 mg capsule      Yes  Yes      Sig: Take 100 mg by mouth daily as needed for Constipation.      levothyroxine (SYNTHROID) 25 mcg tablet      Yes  Yes      Sig: Take 25 mcg by mouth Daily (before breakfast).               Facility-Administered Medications: None          Current Facility-Administered Medications             Medication  Dose  Route  Frequency  Provider  Last Rate  Last Dose              ?  dextrose 5 % - 0.45% NaCl infusion   100 mL/hr  IntraVENous  CONTINUOUS  Simoes, Ruchita S, MD  100 mL/hr at 09/07/17 1044  100 mL/hr at 09/07/17 1044     ?  levothyroxine (SYNTHROID) tablet 25 mcg   25 mcg  Oral  ACB  Simoes, Ruchita S, MD     Stopped at 09/07/17 1100     ?  sodium chloride (NS) flush 5-10 mL   5-10 mL  IntraVENous   Q8H  Jasarevic, Muhamed, MD     10 mL at 09/07/17 0500     ?  sodium chloride (NS) flush 5-10 mL   5-10 mL  IntraVENous  PRN  Irving Burton, MD           ?  naloxone (NARCAN) injection 0.1 mg   0.1 mg  IntraVENous  PRN  Irving Burton, MD           ?  acetaminophen (TYLENOL) tablet 650 mg   650 mg  Oral  Q6H PRN  Jasarevic, Muhamed, MD           ?  ondansetron (ZOFRAN) injection 4 mg   4 mg  IntraVENous  Q6H PRN  Irving Burton, MD     4 mg at 09/07/17 0804              ?  piperacillin-tazobactam (ZOSYN) 3.375 g in 0.9% sodium chloride (MBP/ADV) 100 mL MBP   3.375 g  IntraVENous  Q8H  Irving Burton, MD  25 mL/hr at 09/07/17 0500  3.375 g at 09/07/17 0500        No past surgical history on file.   No family history on file.     Social History          Socioeconomic History         ?  Marital status:  MARRIED              Spouse name:  Not on file         ?  Number of children:  Not on file     ?  Years of education:  Not on file     ?  Highest education level:  Not on file       Occupational History        ?  Not on file       Social Needs         ?  Financial resource strain:  Not on file        ?  Food insecurity:              Worry:  Not on file         Inability:  Not on file        ?  Transportation needs:              Medical:  Not on file         Non-medical:  Not on file       Tobacco Use         ?  Smoking status:  Not on file       Substance and Sexual Activity         ?  Alcohol use:  Not on file     ?  Drug use:  Not on file     ?  Sexual activity:  Not on file       Lifestyle        ?  Physical activity:              Days per week:  Not on file         Minutes per session:  Not on file         ?  Stress:  Not on file       Relationships        ?  Social connections:              Talks on phone:  Not on file         Gets together:  Not on file         Attends religious service:  Not on file         Active member of club or organization:  Not on file         Attends meetings of clubs or  organizations:  Not on file         Relationship status:  Not on file        ?  Intimate partner violence:              Fear of current or ex partner:  Not on file         Emotionally abused:  Not on file         Physically abused:  Not on file         Forced sexual activity:  Not on file  Other Topics  Concern        ?  Not on file       Social History Narrative        ?  Not on file           Review of Systems:      Constitutional:  Malaise, fever, chills. No weight loss.    Eyes: No visual symptoms.   ENT: No sore throat, runny nose or ear pain.   Respiratory: No cough, dyspnea or wheezing.   Cardiovascular: No chest pain, pressure, palpitations, tightness or heaviness.   Gastrointestinal: Nausea, upper abdominal pain. No dysphagia, odynophagia, dyspepsia, vomiting, hematemesis, melena, hematochezia, rectal bleeding, or recent changes in bowel habits.   Genitourinary: Dark urine, dysuria. No frequency or urgency.   Musculoskeletal: No joint pain or swelling.   Integumentary: No rashes.   Neurological: No headaches, sensory or motor symptoms.           Objective:        Visit Vitals      BP  102/61 (BP 1 Location: Left arm, BP Patient Position: Sitting)     Pulse  75     Temp  97.9 ??F (36.6 ??C)     Resp  20     Ht  5\' 8"  (1.727 m)     Wt  77.1 kg (169 lb 15.6 oz)     SpO2  99%        BMI  25.84 kg/m??        Temp (24hrs), Avg:98.2 ??F (36.8 ??C), Min:97.9 ??F (36.6 ??C), Max:98.7 ??F (37.1 ??C)        Vitals:             09/06/17 1909  09/06/17 2119  09/07/17 0405  09/07/17 0818           BP:  120/76  105/52  100/58  102/61     Pulse:  79  85  86  75     Resp:  20  18  17  20      Temp:  98.2 ??F (36.8 ??C)  98.2 ??F (36.8 ??C)  98.7 ??F (37.1 ??C)  97.9 ??F (36.6 ??C)     SpO2:  100%  100%  98%  99%     Weight:    77.1 kg (169 lb 15.6 oz)               Height:                   Intake/Output Summary (Last 24 hours) at 09/07/2017 1050   Last data filed at 09/07/2017 0915     Gross per 24 hour        Intake  1120 ml         Output  --        Net  1120 ml           Physical Exam:      Constitutional: Appearance and behavior are age and situation appropriate.   HEENT: Conjunctivae clear. Mucous membranes moist, non-erythematous. Neck: Supple, non tender, symmetrical.    Respiratory: Lungs clear to auscultation, nonlabored respirations. No tachypnea or accessory muscle use.   Cardiovascular: Heart regular rate without. No peripheral edema.     Gastrointestinal: Soft, mildly tender over left and right upper quadrant, bowel sounds present, nondistended.   Musculoskeletal: Extremities appear atraumatic.     Integumentary: Warm and dry.    Neurologic: Alert and oriented,  sensation intact, motor strength equal and symmetric.      DATA        CBC w/Diff     Lab Results      Component  Value  Date/Time        WBC  3.6 (L)  09/07/2017 05:51 AM        WBC  3.7 (L)  09/06/2017 03:00 PM        RBC  3.84  09/07/2017 05:51 AM        RBC  4.46  09/06/2017 03:00 PM        HGB  11.9 (L)  09/07/2017 05:51 AM        HGB  13.3  09/06/2017 03:00 PM        HCT  36.4 (L)  09/07/2017 05:51 AM        HCT  41.8  09/06/2017 03:00 PM        MCV  94.8  09/07/2017 05:51 AM        MCV  93.7  09/06/2017 03:00 PM        MCH  31.0  09/07/2017 05:51 AM        MCH  29.8  09/06/2017 03:00 PM        MCHC  32.7  09/07/2017 05:51 AM        MCHC  31.8  09/06/2017 03:00 PM        PLT  140  09/07/2017 05:51 AM        PLT  154  09/06/2017 03:00 PM           Lab Results      Component  Value  Date/Time        BANDS  1.9  09/06/2017 03:00 PM        GRANS  34.0  09/07/2017 05:51 AM        GRANS  38.7  09/06/2017 03:00 PM        MONOS  7.0  09/07/2017 05:51 AM        MONOS  7.6  09/06/2017 03:00 PM        EOS  5.0  09/07/2017 05:51 AM        EOS  2.8  09/06/2017 03:00 PM        BASOS  0.6  09/07/2017 05:51 AM        BASOS  0.0  09/06/2017 03:00 PM                  Hepatic Function     Recent Labs         09/07/17   0551  09/06/17   1500      ALT  4,622*  5,641*      SGOT  3,046*   4,079*      TBILI  3.7*  3.6*      AP  240*  314*      ALB  2.9*  3.6      TP  6.1*  8.0                  Pancreatic Enzymes     Recent Labs         09/06/17   1500      LPSE  165                  Basic Metabolic Profile     Recent Labs         09/07/17   0551  09/06/17   1500      NA  141  139      K  4.0  4.0      CL  108*  105      CO2  26  28      BUN  10  10      GLU  92  87      CREA  0.6  0.8      CA  8.2*  9.3                POC Glucose   No components found for: [facility]        Coagulation     Recent Labs         09/06/17   2030      PTP  13.5*      INR  1.2*                   Nelta Numbers, NP, FNP-BC   Sep 07, 2017   [facility]   [phone] (c)   [place] Office [phone]

## 2017-09-07 NOTE — Other (Signed)
Bedside and Verbal shift change report given to Shamir R Isidro, RN   (oncoming nurse) by Allyson,RN (offgoing nurse). Report included the following information SBAR, Kardex, MAR and Recent Results.

## 2017-09-07 NOTE — Progress Notes (Signed)
Patient admitted on 09/06/2017 from home with   Chief Complaint   Patient presents with   ??? Diarrhea   ??? Fever   ??? Vomiting   ??? Chest Pain        The patient has been admitted to the hospital 0 times in the past 12 months.        Tentative dc plan:    Home with family at time husband is deployed. Her mom n law and sister n law are with her and pts dtr/ no needs noted at this time.    Facility if plan     Anticipated Discharge Date:   tbd    PCP: Other, Phys, MD per pt sees Beau Fanny at [facility], address, contact info and insurance verified  yes    Dialysis Unit/ chair time / access:    n/a        DME at home  none    Home Environment:    Lives at [address]  [place] [address]    @.     Prior to admission open services:     none    Home Health Agency-     n/a    Personal Care Agency-    n/a    Extended Emergency Contact Information  Primary Emergency ContactKALIANNE, FETTING  Home Phone: [phone]  Mobile Phone: [phone]  Relation: Spouse  Secondary Emergency Contact: Clarisa Fling  Home Phone: [phone]  Mobile Phone: [phone]  Relation: Friend      Transportation:     Sis n Company secretary will transport home      Case Management Assessment                          PRIMARY DECISION MAKER    self                               CARE MANAGEMENT INTERVENTIONS   Readmission Interview Completed: Not Applicable   PCP Verified by CM: Yes(per pt sees Lt Sarah at [facility])           Mode of Transport at Discharge: Other (see comment)(sis n law Victorino Dike)       Transition of Care Consult (CM Consult): Discharge Planning               Discharge Durable Medical Equipment: No   Physical Therapy Consult: No   Occupational Therapy Consult: No   Speech Therapy Consult: No   Current Support Network: Lives with Spouse, Own Home, Other(mom n law and sis  n law here. )                                       Primary Language: English                                                       DISCHARGE LOCATION

## 2017-09-07 NOTE — Other (Signed)
Bedside and Verbal shift change report given to Allyson Sawyer Johnson, RN (oncoming nurse) by Darylle, RN (offgoing nurse). Report included the following information SBAR and Kardex.

## 2017-09-07 NOTE — Consults (Signed)
A member of [facility]    Consult Note    Patient: Alyssa Lowe Age: 42 y.o. Sex: female    Date of Birth: Jun 15, 1975 Admit Date: 09/06/2017 PCP: Sol Blazing, MD   MRN: [id number]  CSN: [id number]         IMPRESSION:     1. Acute hepatitis A  - INR 1.2, Tbili 3.7.   2. UTI - POA    RECOMMENDATIONS:     ?? Diet as tolerated.  ?? Hepatic function and PT/INR in am.  ?? Anticipate transaminases will peak and begin to trend down soon with later peak in Tbili.   ?? If INR stable, should be able to go home tomorrow.    ?? Recommend check hepatic function and INR/PT in 5-7days after DC, repeat in 1 week, then monthly until normal.   ?? Recommend patient follow up at [facility] with her PCP in 1 week.   ?? Instructed on proper hand hygiene and to avoid alcohol for next 6-8 weeks.   ?? Safe to use Tylenol as directed (no more than 3 grams in 24 hrs).     HPI:     I was asked by Dr. Sandie Ano to evaluate Alyssa Lowe for acute hepatitis A.  She is a 42 y.o. female admitted on 09/06/2017 for Hepatitis A [B15.9]. Patient presented to [facility] for malaise, fever, chills, and nausea x 1 week.  Neurology revealed marked elevation of transaminases and she was admitted for acute hepatitis A.  She does routinely drink raw shellfish, fish, and commercially purchased fruit/vegetable smoothies.  No recent travel or sick contacts.  She drinks 2-3 alcoholic beverages on the weekends.  She does have tattoos, denies intranasal/IV drug use.    Patient Active Problem List    Diagnosis Date Noted   ??? Hepatitis A 09/06/2017      No past medical history on file.  No Known Allergies    Prior to Admission Medications   Prescriptions Last Dose Informant Patient Reported? Taking?   cetirizine (ZYRTEC) 10 mg tablet   Yes Yes   Sig: Take 10 mg by mouth daily.   docusate sodium (COLACE) 100 mg capsule   Yes Yes   Sig: Take 100 mg by mouth daily as needed for Constipation.    levothyroxine (SYNTHROID) 25 mcg tablet   Yes Yes   Sig: Take 25 mcg by mouth Daily (before breakfast).      Facility-Administered Medications: None     Current Facility-Administered Medications   Medication Dose Route Frequency Provider Last Rate Last Dose   ??? dextrose 5 % - 0.45% NaCl infusion  100 mL/hr IntraVENous CONTINUOUS Simoes, Ruchita S, MD 100 mL/hr at 09/07/17 1044 100 mL/hr at 09/07/17 1044   ??? levothyroxine (SYNTHROID) tablet 25 mcg  25 mcg Oral ACB Simoes, Reina Fuse, MD   Stopped at 09/07/17 1100   ??? sodium chloride (NS) flush 5-10 mL  5-10 mL IntraVENous Q8H Jasarevic, Muhamed, MD   10 mL at 09/07/17 0500   ??? sodium chloride (NS) flush 5-10 mL  5-10 mL IntraVENous PRN Irving Burton, MD       ??? naloxone (NARCAN) injection 0.1 mg  0.1 mg IntraVENous PRN Jasarevic, Muhamed, MD       ??? acetaminophen (TYLENOL) tablet 650 mg  650 mg Oral Q6H PRN Jasarevic, Muhamed, MD       ??? ondansetron (ZOFRAN) injection 4 mg  4 mg IntraVENous Q6H PRN Irving Burton, MD  4 mg at 09/07/17 0804   ??? piperacillin-tazobactam (ZOSYN) 3.375 g in 0.9% sodium chloride (MBP/ADV) 100 mL MBP  3.375 g IntraVENous Q8H Jasarevic, Muhamed, MD 25 mL/hr at 09/07/17 0500 3.375 g at 09/07/17 0500     No past surgical history on file.  No family history on file.  Social History     Socioeconomic History   ??? Marital status: MARRIED     Spouse name: Not on file   ??? Number of children: Not on file   ??? Years of education: Not on file   ??? Highest education level: Not on file   Occupational History   ??? Not on file   Social Needs   ??? Financial resource strain: Not on file   ??? Food insecurity:     Worry: Not on file     Inability: Not on file   ??? Transportation needs:     Medical: Not on file     Non-medical: Not on file   Tobacco Use   ??? Smoking status: Not on file   Substance and Sexual Activity   ??? Alcohol use: Not on file   ??? Drug use: Not on file   ??? Sexual activity: Not on file   Lifestyle   ??? Physical activity:      Days per week: Not on file     Minutes per session: Not on file   ??? Stress: Not on file   Relationships   ??? Social connections:     Talks on phone: Not on file     Gets together: Not on file     Attends religious service: Not on file     Active member of club or organization: Not on file     Attends meetings of clubs or organizations: Not on file     Relationship status: Not on file   ??? Intimate partner violence:     Fear of current or ex partner: Not on file     Emotionally abused: Not on file     Physically abused: Not on file     Forced sexual activity: Not on file   Other Topics Concern   ??? Not on file   Social History Narrative   ??? Not on file       Review of Systems:    Constitutional:  Malaise, fever, chills. No weight loss.   Eyes: No visual symptoms.  ENT: No sore throat, runny nose or ear pain.  Respiratory: No cough, dyspnea or wheezing.  Cardiovascular: No chest pain, pressure, palpitations, tightness or heaviness.  Gastrointestinal: Nausea, upper abdominal pain. No dysphagia, odynophagia, dyspepsia, vomiting, hematemesis, melena, hematochezia, rectal bleeding, or recent changes in bowel habits.  Genitourinary: Dark urine, dysuria. No frequency or urgency.  Musculoskeletal: No joint pain or swelling.  Integumentary: No rashes.  Neurological: No headaches, sensory or motor symptoms.      Objective:     Visit Vitals  BP 102/61 (BP 1 Location: Left arm, BP Patient Position: Sitting)   Pulse 75   Temp 97.9 ??F (36.6 ??C)   Resp 20   Ht  (1.727 m)   Wt 77.1 kg (169 lb 15.6 oz)   SpO2 99%   BMI 25.84 kg/m??     Temp (24hrs), Avg:98.2 ??F (36.8 ??C), Min:97.9 ??F (36.6 ??C), Max:98.7 ??F (37.1 ??C)    Vitals:    09/06/17 1909 09/06/17 2119 09/07/17 0405 09/07/17 0818   BP: 120/76 105/52 100/58 102/61   Pulse: 79  85 86 75   Resp: Temp: 98.2 ??F (36.8 ??C) 98.2 ??F (36.8 ??C) 98.7 ??F (37.1 ??C) 97.9 ??F (36.6 ??C)   SpO2: 100% 100% 98% 99%   Weight:  77.1 kg (169 lb 15.6 oz)     Height:            Intake/Output Summary (Last 24 hours) at 09/07/2017 1050  Last data filed at 09/07/2017 0915  Gross per 24 hour   Intake 1120 ml   Output ???   Net 1120 ml       Physical Exam:    Constitutional: Appearance and behavior are age and situation appropriate.  HEENT: Conjunctivae clear. Mucous membranes moist, non-erythematous. Neck: Supple, non tender, symmetrical.   Respiratory: Lungs clear to auscultation, nonlabored respirations. No tachypnea or accessory muscle use.  Cardiovascular: Heart regular rate without. No peripheral edema.    Gastrointestinal: Soft, mildly tender over left and right upper quadrant, bowel sounds present, nondistended.  Musculoskeletal: Extremities appear atraumatic.    Integumentary: Warm and dry.   Neurologic: Alert and oriented, sensation intact, motor strength equal and symmetric.    DATA    CBC w/Diff   Lab Results   Component Value Date/Time    WBC 3.6 (L) 09/07/2017 05:51 AM    WBC 3.7 (L) 09/06/2017 03:00 PM    RBC 3.84 09/07/2017 05:51 AM    RBC 4.46 09/06/2017 03:00 PM    HGB 11.9 (L) 09/07/2017 05:51 AM    HGB 13.3 09/06/2017 03:00 PM    HCT 36.4 (L) 09/07/2017 05:51 AM    HCT 41.8 09/06/2017 03:00 PM    MCV 94.8 09/07/2017 05:51 AM    MCV 93.7 09/06/2017 03:00 PM    MCH 31.0 09/07/2017 05:51 AM    MCH 29.8 09/06/2017 03:00 PM    MCHC 32.7 09/07/2017 05:51 AM    MCHC 31.8 09/06/2017 03:00 PM    PLT 140 09/07/2017 05:51 AM    PLT 154 09/06/2017 03:00 PM     Lab Results   Component Value Date/Time    BANDS 1.9 09/06/2017 03:00 PM    GRANS 34.0 09/07/2017 05:51 AM    GRANS 38.7 09/06/2017 03:00 PM    MONOS 7.0 09/07/2017 05:51 AM    MONOS 7.6 09/06/2017 03:00 PM    EOS 5.0 09/07/2017 05:51 AM    EOS 2.8 09/06/2017 03:00 PM    BASOS 0.6 09/07/2017 05:51 AM    BASOS 0.0 09/06/2017 03:00 PM        Hepatic Function   Recent Labs     09/07/17  0551 09/06/17  1500   ALT 4,622* 5,641*   SGOT 3,046* 4,079*   TBILI 3.7* 3.6*   AP 240* 314*   ALB 2.9* 3.6   TP 6.1* 8.0        Pancreatic Enzymes    Recent Labs     09/06/17  1500   LPSE 165        Basic Metabolic Profile   Recent Labs     09/07/17  0551 09/06/17  1500   NA 141 139   K 4.0 4.0   CL 108* 105   CO2 26 28   BUN 10 10   GLU 92 87   CREA 0.6 0.8   CA 8.2* 9.3        POC Glucose  No components found for: [facility]    Coagulation   Recent Labs     09/06/17  2030  PTP 13.5*   INR 1.2*          Nelta Numbers, NP, FNP-BC  Sep 07, 2017  [facility]  [phone] (c)  [place] Office [phone]

## 2017-09-07 NOTE — Progress Notes (Signed)
INTERNAL MEDICINE PROGRESS NOTE    Date of note:      Sep 07, 2017    Patient:               Alyssa Lowe, 42 y.o., female  Admit Date:        09/06/2017  Length of Stay:  0 day(s)    Problem List:   Patient Active Problem List   Diagnosis Code   ??? Hepatitis A B15.9       Subjective + interval history     Family at bedside  Patient complains of RUQ discomfort and poor appetite.     Assessment :     Acute Hepatitis A  UTI  Elevated LFT,  hypothyroidism        Plan :     - continue supportive care, IV hydration, antiemetics    Would switch Zosyn to Ceftriaxone for UTI. Short course - 3 days. Follow urine culture    GI input appreciated    Follow CMP, INR        - Code status: full code    Recommend to continue hospitalization.  Expected date of discharge: 1-2 days  Plan for disposition - home tomorrow     Objective:     Visit Vitals  BP 102/61 (BP 1 Location: Left arm, BP Patient Position: Sitting)   Pulse 75   Temp 97.9 ??F (36.6 ??C)   Resp 20   Ht 5' 8" (1.727 m)   Wt 77.1 kg (169 lb 15.6 oz)   SpO2 99%   BMI 25.84 kg/m??           Intake/Output Summary (Last 24 hours) at 09/07/2017 1027  Last data filed at 09/07/2017 0915  Gross per 24 hour   Intake 1120 ml   Output ???   Net 1120 ml       Physical Exam:     GEN - AAOx3, anxious  HEENT - icteric, mucous membranes moist  Neck - supple, no JVD  Cardiac - RRR, S1, S2, no murmurs  Chest/Lungs - clear to auscultation without wheezes or rhonchi  Abdomen - soft, RUQ and epigastric tenderness  Extremities - no clubbing/ cyanosis/ edema  Neuro - CN 2-12 intact. No focal deficits. No motor or sensory deficit appreciated.   Skin - no rashes or lesions    Current medications:       Current Facility-Administered Medications:   ???  sodium chloride (NS) flush 5-10 mL, 5-10 mL, IntraVENous, Q8H, Jasarevic, Muhamed, MD, 10 mL at 09/07/17 0500  ???  sodium chloride (NS) flush 5-10 mL, 5-10 mL, IntraVENous, PRN, Rosalene Billings, MD   ???  naloxone (NARCAN) injection 0.1 mg, 0.1 mg, IntraVENous, PRN, Jasarevic, Muhamed, MD  ???  acetaminophen (TYLENOL) tablet 650 mg, 650 mg, Oral, Q6H PRN, Jasarevic, Muhamed, MD  ???  heparin (porcine) injection 5,000 Units, 5,000 Units, SubCUTAneous, Q12H, Jasarevic, Muhamed, MD  ???  ondansetron (ZOFRAN) injection 4 mg, 4 mg, IntraVENous, Q6H PRN, Jasarevic, Muhamed, MD, 4 mg at 09/07/17 0804  ???  0.9% sodium chloride infusion, 125 mL/hr, IntraVENous, CONTINUOUS, Jasarevic, Muhamed, MD, Last Rate: 125 mL/hr at 09/07/17 0504, 125 mL/hr at 09/07/17 0504  ???  piperacillin-tazobactam (ZOSYN) 3.375 g in 0.9% sodium chloride (MBP/ADV) 100 mL MBP, 3.375 g, IntraVENous, Q8H, Jasarevic, Muhamed, MD, Last Rate: 25 mL/hr at 09/07/17 0500, 3.375 g at 09/07/17 0500    Labs:     Recent Results (from the past 24 hour(s))   EKG, 12  LEAD, INITIAL    Collection Time: 09/06/17  2:26 PM   Result Value Ref Range    Ventricular Rate 85 BPM    Atrial Rate 85 BPM    P-R Interval 110 ms    QRS Duration 88 ms    Q-T Interval 366 ms    QTC Calculation (Bezet) 435 ms    Calculated P Axis 7 degrees    Calculated R Axis 7 degrees    Calculated T Axis 17 degrees    Diagnosis       Sinus rhythm with short PR  Low voltage QRS  Nonspecific ST abnormality  Abnormal ECG  No previous ECGs available  Confirmed by Truman Hayward, M.D., [facility] ([id number]) on 09/06/2017 7:40:16 PM     CBC WITH AUTOMATED DIFF    Collection Time: 09/06/17  3:00 PM   Result Value Ref Range    WBC 3.7 (L) 4.0 - 11.0 1000/mm3    NEUTROPHILS 38.7 34 - 64 %    LYMPHOCYTES 37.7 28 - 48 %    RBC 4.46 3.60 - 5.20 M/uL    MONOCYTES 7.6 1 - 13 %    HGB 13.3 13.0 - 17.2 gm/dl    EOSINOPHILS 2.8 0 - 5 %    HCT 41.8 37.0 - 50.0 %    MCV 93.7 80.0 - 98.0 fL    BAND NEUTROPHILS 1.9 0 - 11 %    MCH 29.8 25.4 - 34.6 pg    MCHC 31.8 30.0 - 36.0 gm/dl    PLATELET 154 140 - 450 1000/mm3    MPV 10.6 (H) 6.0 - 10.0 fL    ATYPICAL LYMPHS 7.5 (H) 0 - 0 %    RDW-SD 43.3 36.4 - 46.3      NRBC 0 0 - 0       IMMATURE GRANULOCYTES 0.5 0.0 - 3.0 %    BASOPHILS 0.0 0 - 3 %    Smudge cells 1.9      PLASMA CELL 4      Poikilocytosis OCCASIONAL      PLATELET COMMENTS NORMAL      Large platelets OCCASIONAL      Ovalocytes-DIF OCCASIONAL     LIPASE    Collection Time: 09/06/17  3:00 PM   Result Value Ref Range    Lipase 165 73 - 094 U/L   METABOLIC PANEL, COMPREHENSIVE    Collection Time: 09/06/17  3:00 PM   Result Value Ref Range    Sodium 139 136 - 145 mEq/L    Potassium 4.0 3.5 - 5.1 mEq/L    Chloride 105 98 - 107 mEq/L    CO2 28 21 - 32 mEq/L    Glucose 87 74 - 106 mg/dl    BUN 10 7 - 25 mg/dl    Creatinine 0.8 0.6 - 1.3 mg/dl    GFR est AA >60.0      GFR est non-AA >60      Calcium 9.3 8.5 - 10.1 mg/dl    AST (SGOT) 4,079 (H) 15 - 37 U/L    ALT (SGPT) 5,641 (H) 12 - 78 U/L    Alk. phosphatase 314 (H) 45 - 117 U/L    Bilirubin, total 3.6 (H) 0.2 - 1.0 mg/dl    Protein, total 8.0 6.4 - 8.2 gm/dl    Albumin 3.6 3.4 - 5.0 gm/dl    Anion gap 6 5 - 15 mmol/L   MONONUCLEOSIS SCREEN    Collection Time: 09/06/17  4:15 PM  Result Value Ref Range    Mononucleosis screen NEGATIVE NEGATIVE     HEPATITIS PANEL, ACUTE    Collection Time: 09/06/17  4:15 PM   Result Value Ref Range    Hepatitis A, IgM REACTIVE (A) NON-REACTIVE      Hepatitis B core, IgM NON-REACTIVE      Hepatitis B surface Ag NON-REACTIVE NON-REACTIVE      Hepatitis C virus Ab NON-REACTIVE NON-REACTIVE      Signal to Cutoff (Hep C) 0     URINALYSIS W/ RFLX MICROSCOPIC    Collection Time: 09/06/17  4:35 PM   Result Value Ref Range    Color AMBER (A) YELLOW,STRAW      Appearance SLIGHTLY CLOUDY (A) CLEAR      Glucose 100 (A) NEGATIVE,Negative mg/dl    Bilirubin LARGE (A) NEGATIVE,Negative      Ketone TRACE (A) NEGATIVE,Negative mg/dl    Specific gravity 1.020 1.005 - 1.030      Blood TRACE (A) NEGATIVE,Negative      pH (UA) 6.5 5.0 - 9.0      Protein 100 (A) NEGATIVE,Negative mg/dl    Urobilinogen 2.0 (H) 0.0 - 1.0 mg/dl    Nitrites POSITIVE (A) NEGATIVE,Negative       Leukocyte Esterase TRACE (A) NEGATIVE,Negative     POC URINE MICROSCOPIC    Collection Time: 09/06/17  4:35 PM   Result Value Ref Range    Epithelial cells, squamous >50 /LPF    WBC 1-4 /HPF    Bacteria 1+ /HPF    CRYSTALS, CALCIUM OXALATE OCCASIONAL /HPF   HCG URINE, QL    Collection Time: 09/06/17  4:45 PM   Result Value Ref Range    HCG urine, QL NEGATIVE NEGATIVE     CULTURE, URINE    Collection Time: 09/06/17  5:15 PM   Result Value Ref Range    Culture result No Growth To Date     CULTURE, BLOOD    Collection Time: 09/06/17  5:25 PM   Result Value Ref Range    Blood Culture Result Culture In Progress, Daily Updates To Follow     CULTURE, BLOOD    Collection Time: 09/06/17  5:38 PM   Result Value Ref Range    Blood Culture Result Culture In Progress, Daily Updates To Follow     PROTHROMBIN TIME + INR    Collection Time: 09/06/17  8:30 PM   Result Value Ref Range    Prothrombin time 13.5 (H) 10.2 - 12.9 seconds    INR 1.2 (H) 0.1 - 1.1     GLUCOSE, POC    Collection Time: 09/06/17  9:21 PM   Result Value Ref Range    Glucose (POC) 89 65 - 105 mg/dL   CBC WITH AUTOMATED DIFF    Collection Time: 09/07/17  5:51 AM   Result Value Ref Range    WBC 3.6 (L) 4.0 - 11.0 1000/mm3    RBC 3.84 3.60 - 5.20 M/uL    HGB 11.9 (L) 13.0 - 17.2 gm/dl    HCT 36.4 (L) 37.0 - 50.0 %    MCV 94.8 80.0 - 98.0 fL    MCH 31.0 25.4 - 34.6 pg    MCHC 32.7 30.0 - 36.0 gm/dl    PLATELET 140 140 - 450 1000/mm3    MPV 10.5 (H) 6.0 - 10.0 fL    RDW-SD 45.0 36.4 - 46.3      NRBC 0 0 - 0      IMMATURE GRANULOCYTES  0.3 0.0 - 3.0 %    NEUTROPHILS 34.0 34 - 64 %    LYMPHOCYTES 49.0 (H) 28 - 48 %    ATYPICAL LYMPHS 5.0 (H) 0 - 0 %    MONOCYTES 7.0 1 - 13 %    EOSINOPHILS 5.0 0 - 5 %    BASOPHILS 0.6 0 - 3 %    PLATELET COMMENTS NORMAL      Smudge cells FEW     METABOLIC PANEL, COMPREHENSIVE    Collection Time: 09/07/17  5:51 AM   Result Value Ref Range    Sodium 141 136 - 145 mEq/L    Potassium 4.0 3.5 - 5.1 mEq/L    Chloride 108 (H) 98 - 107 mEq/L     CO2 26 21 - 32 mEq/L    Glucose 92 74 - 106 mg/dl    BUN 10 7 - 25 mg/dl    Creatinine 0.6 0.6 - 1.3 mg/dl    GFR est AA >60.0      GFR est non-AA >60      Calcium 8.2 (L) 8.5 - 10.1 mg/dl    AST (SGOT) 3,046 (H) 15 - 37 U/L    ALT (SGPT) 4,622 (H) 12 - 78 U/L    Alk. phosphatase 240 (H) 45 - 117 U/L    Bilirubin, total 3.7 (H) 0.2 - 1.0 mg/dl    Protein, total 6.1 (L) 6.4 - 8.2 gm/dl    Albumin 2.9 (L) 3.4 - 5.0 gm/dl    Anion gap 7 5 - 15 mmol/L       XR Results:  Results from Hospital Encounter encounter on 09/06/17   XR CHEST PA LAT    Narrative EXAM: Frontal and lateral views of the chest.    INDICATIONS: Fever.    COMPARISON: None.    FINDINGS: No alveolar consolidations, congestive changes or pleural effusions.  Cardiomediastinal silhouette normal in size and contour.      Impression IMPRESSION: No acute cardiopulmonary process.         CT Results:  No results found for this or any previous visit.    MRI Results:  No results found for this or any previous visit.    Nuclear Medicine Results:  No results found for this or any previous visit.    [place] Results:  Results from [place] encounter on 09/06/17   [place] RUQ    Narrative Examination: Ultrasound right upper quadrant.    INDICATION: Right upper quadrant pain. Elevated LFTs.    FINDINGS:    Liver appears unremarkable. Normal hepatic parenchyma. No focal hepatic lesions.  No significant intrahepatic biliary ductal dilatation. CBD within normal limits  roughly measuring 0.4 cm.    The gallbladder is contracted. There is mild irregular thickening of the  gallbladder wall. No significant surrounding inflammatory change. Portions of  the gallbladder wall measure up to 0.7 cm in thickness. There is evidence of  sonographic Murphy's sign per sonographer.    Visualized pancreas grossly unremarkable.    Proximal aorta and IVC grossly unremarkable.    The right kidney is normal in size and appears unremarkable.      Impression IMPRESSION:   1. The gallbladder is contracted, demonstrating some irregular wall thickening.  The thickening of the gallbladder wall may be entirely related to decompression  of the gallbladder. No stone evidence of flow within the gallbladder wall to  suggest a mass. Recommend clinical correlation. May consider HIDA scan,  particularly in the setting of positive sonographic  Murphy's sign per  sonographer. Alternatively, recommend follow-up ultrasound in 2 weeks for  comparison.  2. Otherwise, unremarkable exam.         IR Results:  No results found for this or any previous visit.    VAS/US Results:  No results found for this or any previous visit.      Total clinical care time was 30   minutes of which more than 50% was spent in coordination of care and counseling (time spent with patient/family face to face, physical exam, reviewing laboratory and imaging investigations, speaking with physicians and nursing staff involved in this patient's care).     Perfecto Kingdom,  MD  [facility]  Sep 07, 2017  Time: 10:27 AM

## 2017-09-08 LAB — CULTURE, URINE
CULTURE RESULT: NO GROWTH
Culture result: NO GROWTH

## 2017-09-08 LAB — CBC WITH AUTOMATED DIFF
ATYPICAL LYMPHS: 11.4 % — ABNORMAL HIGH (ref 0–0)
EOSINOPHILS: 2.9 % (ref 0–5)
HCT: 36.7 % — ABNORMAL LOW (ref 37.0–50.0)
HGB: 11.9 gm/dl — ABNORMAL LOW (ref 13.0–17.2)
LYMPHOCYTES: 35.2 % (ref 28–48)
MCH: 30.7 pg (ref 25.4–34.6)
MCHC: 32.4 gm/dl (ref 30.0–36.0)
MCV: 94.6 fL (ref 80.0–98.0)
MONOCYTES: 6.7 % (ref 1–13)
MPV: 11.2 fL — ABNORMAL HIGH (ref 6.0–10.0)
NEUTROPHILS: 43.8 % (ref 34–64)
PLATELET COMMENTS: NORMAL
PLATELET: 153 10*3/uL (ref 140–450)
RBC Morphology: NORMAL
RBC: 3.88 M/uL (ref 3.60–5.20)
RDW-SD: 44.8 (ref 36.4–46.3)
WBC: 4 10*3/uL (ref 4.0–11.0)

## 2017-09-08 LAB — METABOLIC PANEL, COMPREHENSIVE
ALT (SGPT): 4554 U/L — ABNORMAL HIGH (ref 12–78)
AST (SGOT): 2492 U/L — ABNORMAL HIGH (ref 15–37)
Albumin: 2.8 gm/dl — ABNORMAL LOW (ref 3.4–5.0)
Alk. phosphatase: 233 U/L — ABNORMAL HIGH (ref 45–117)
Anion gap: 5 mmol/L (ref 5–15)
BUN: 6 mg/dl — ABNORMAL LOW (ref 7–25)
Bilirubin, total: 3.6 mg/dl — ABNORMAL HIGH (ref 0.2–1.0)
CO2: 26 mEq/L (ref 21–32)
Calcium: 8.1 mg/dl — ABNORMAL LOW (ref 8.5–10.1)
Chloride: 107 mEq/L (ref 98–107)
Creatinine: 0.6 mg/dl (ref 0.6–1.3)
GFR est AA: >60
GFR est non-AA: >60
Glucose: 123 mg/dl — ABNORMAL HIGH (ref 74–106)
Potassium: 4 mEq/L (ref 3.5–5.1)
Protein, total: 6.5 gm/dl (ref 6.4–8.2)
Sodium: 138 mEq/L (ref 136–145)

## 2017-09-08 LAB — PROTHROMBIN TIME + INR
INR: 1.2 — ABNORMAL HIGH (ref 0.1–1.1)
Prothrombin time: 14.4 seconds — ABNORMAL HIGH (ref 10.2–12.9)

## 2017-09-08 MED ORDER — ONDANSETRON 4 MG TAB, RAPID DISSOLVE
4 mg | ORAL_TABLET | Freq: Three times a day (TID) | ORAL | 0 refills | Status: AC | PRN
Start: 2017-09-08 — End: ?

## 2017-09-08 MED FILL — CEFTRIAXONE 1 GRAM SOLUTION FOR INJECTION: 1 gram | INTRAMUSCULAR | Qty: 1

## 2017-09-08 MED FILL — LEVOTHYROXINE 25 MCG TAB: 25 mcg | ORAL | Qty: 1

## 2017-09-08 NOTE — Discharge Summary (Signed)
Discharge Summary by Everlene Farrier, MD at 09/08/17 1204                Author: Everlene Farrier, MD  Service: Hospitalist  Author Type: Physician       Filed: 09/08/17 1230  Date of Service: 09/08/17 1204  Status: Signed          Editor: Everlene Farrier, MD (Physician)                  Discharge Summary                   Patient ID:   Alyssa Lowe, 42 y.o.,  female   DOB: 10-May-1975      Admit Date: 09/06/2017   Discharge Date:  09/08/2017   Length of stay: 0 day(s)      PCP:  Other, Phys, MD        Chief Complaint       Patient presents with        ?  Diarrhea     ?  Fever     ?  Vomiting        ?  Chest Pain              Hospital Problems   Never Reviewed                         Codes  Class  Noted  POA              Hepatitis A  ICD-10-CM: B15.9   ICD-9-CM: 070.1    09/06/2017  Unknown                          Discharge Diagnosis:       1.  Acute Hepatitis A   2.  UTI present on admission - urine culture neg. Completed short course antibiotic   3.  Elevated LFT,   4.  hypothyroidism         Hospital course      Admitted to floor. Started on IV fluids and supportive treatment. Seen by gastroenterology - INR normal. No active GI intervention recommended. Anticipate transaminases will peak and begin to trend down soon with later peak in [place].    Patient is feeling better and requesting to be discharged home on 09/08/17            Discharge instructions:     Will need LFT, INR check in one week  And then monthly till normal - script provided to patient - she will follow up with her PCP   Discussed with patient to ensure proper hand hygiene and avoid alcohol for 8 weeks      Follow-up:   PCP at [facility] - one week         HPI on Admission (per admitting physician Dr.Jasarevic):   Alyssa Lowe is a 42 y.o. year old female who presents with     headache lastingseveral days. Last Thursday suddenly felt very tired and had a fever. Went to her PCP Thursday. Was tested for strep and flu with  negative results. She has been taking ABX for sinus infection and reports fever of 103, feeling worse  overall, diarrhea, sweating, dizziness, nausea. Past two days has been vomiting along with blurry vision. Stopped taking her antibiotic Sunday??"augmentin".     ??   RUQ US shows: 1. The gallbladder  is contracted, demonstrating some irregular wall thickening.  The thickening of the gallbladder wall may be entirely related to decompression  of the gallbladder. No stone evidence of flow within the gallbladder  wall to  suggest a mass. Recommend clinical correlation. May consider HIDA scan,  particularly in the setting of positive sonographic Murphy's sign per  sonographer. Alternatively, recommend follow-up ultrasound in 2 weeks for  comparison.   2. Otherwise, unremarkable exam.            Condition at discharge:   stable      Disposition: home      Code status : full code      Consultants:     1.  Dr. Jeanie Cooks - GI      Physical Exam on Discharge:   Visit Vitals      BP  108/68 (BP 1 Location: Left arm, BP Patient Position: Sitting)     Pulse  81     Temp  97.6 ??F (36.4 ??C)     Resp  20     Ht  '5\' 8"'  (1.727 m)     Wt  77.1 kg (169 lb 15.6 oz)     SpO2  99%        BMI  25.84 kg/m??        GEN - AAOx3, comfortable   HEENT - icteric, mucous membranes moist   Neck - supple, no JVD   Cardiac - RRR, S1, S2, no murmurs   Chest/Lungs - clear to auscultation without wheezes or rhonchi   Abdomen - soft, mild  RUQ and epigastric tenderness   Extremities - no clubbing/ cyanosis/ edema   Neuro - CN 2-12 intact. No focal deficits. No motor or sensory deficit appreciated.    Skin - no rashes or lesions            Discharge medications :     Current Discharge Medication List              START taking these medications          Details        ondansetron (ZOFRAN ODT) 4 mg disintegrating tablet  Take 1 Tab by mouth every eight (8) hours as needed for Nausea.   Qty: 20 Tab, Refills:  0                     CONTINUE these medications which  have NOT CHANGED          Details        levothyroxine (SYNTHROID) 25 mcg tablet  Take 25 mcg by mouth Daily (before breakfast).               cetirizine (ZYRTEC) 10 mg tablet  Take 10 mg by mouth daily.               docusate sodium (COLACE) 100 mg capsule  Take 100 mg by mouth daily as needed for Constipation.                               Most Recent Labs:     Recent Results (from the past 24 hour(s))     CBC WITH AUTOMATED DIFF          Collection Time: 09/08/17  3:58 AM         Result  Value  Ref Range  WBC  4.0  4.0 - 11.0 1000/mm3       NEUTROPHILS  43.8  34 - 64 %       LYMPHOCYTES  35.2  28 - 48 %       RBC  3.88  3.60 - 5.20 M/uL       MONOCYTES  6.7  1 - 13 %       HGB  11.9 (L)  13.0 - 17.2 gm/dl       EOSINOPHILS  2.9  0 - 5 %       HCT  36.7 (L)  37.0 - 50.0 %       MCV  94.6  80.0 - 98.0 fL       MCH  30.7  25.4 - 34.6 pg       MCHC  32.4  30.0 - 36.0 gm/dl       PLATELET  153  140 - 450 1000/mm3       MPV  11.2 (H)  6.0 - 10.0 fL       ATYPICAL LYMPHS  11.4 (H)  0 - 0 %       RDW-SD  44.8  36.4 - 46.3         RBC Morphology  NORMAL          PLATELET COMMENTS  NORMAL          METABOLIC PANEL, COMPREHENSIVE          Collection Time: 09/08/17  3:58 AM         Result  Value  Ref Range            Sodium  138  136 - 145 mEq/L       Potassium  4.0  3.5 - 5.1 mEq/L       Chloride  107  98 - 107 mEq/L       CO2  26  21 - 32 mEq/L       Glucose  123 (H)  74 - 106 mg/dl       BUN  6 (L)  7 - 25 mg/dl       Creatinine  0.6  0.6 - 1.3 mg/dl       GFR est AA  >60.0          GFR est non-AA  >60          Calcium  8.1 (L)  8.5 - 10.1 mg/dl       AST (SGOT)  2,492 (H)  15 - 37 U/L       ALT (SGPT)  4,554 (H)  12 - 78 U/L       Alk. phosphatase  233 (H)  45 - 117 U/L       Bilirubin, total  3.6 (H)  0.2 - 1.0 mg/dl       Protein, total  6.5  6.4 - 8.2 gm/dl       Albumin  2.8 (L)  3.4 - 5.0 gm/dl       Anion gap  5  5 - 15 mmol/L       PROTHROMBIN TIME + INR          Collection Time: 09/08/17  3:58 AM          Result  Value  Ref Range            Prothrombin time  14.4 (H)  10.2 - 12.9 seconds  INR  1.2 (H)  0.1 - 1.1                XR Results:     Results from Hospital Encounter encounter on 09/06/17     XR CHEST PA LAT           Narrative  EXAM: Frontal and lateral views of the chest.      INDICATIONS: Fever.      COMPARISON: None.      FINDINGS: No alveolar consolidations, congestive changes or pleural effusions.   Cardiomediastinal silhouette normal in size and contour.              Impression  IMPRESSION: No acute cardiopulmonary process.              CT Results:   No results found for this or any previous visit.      MRI Results:   No results found for this or any previous visit.      Nuclear Medicine Results:   No results found for this or any previous visit.      [place] Results:     Results from [place] encounter on 09/06/17     [place] RUQ           Narrative  Examination: Ultrasound right upper quadrant.      INDICATION: Right upper quadrant pain. Elevated LFTs.      FINDINGS:      Liver appears unremarkable. Normal hepatic parenchyma. No focal hepatic lesions.   No significant intrahepatic biliary ductal dilatation. CBD within normal limits   roughly measuring 0.4 cm.      The gallbladder is contracted. There is mild irregular thickening of the   gallbladder wall. No significant surrounding inflammatory change. Portions of   the gallbladder wall measure up to 0.7 cm in thickness. There is evidence of   sonographic Murphy's sign per sonographer.      Visualized pancreas grossly unremarkable.      Proximal aorta and IVC grossly unremarkable.      The right kidney is normal in size and appears unremarkable.              Impression  IMPRESSION:   1. The gallbladder is contracted, demonstrating some irregular wall thickening.   The thickening of the gallbladder wall may be entirely related to decompression   of the gallbladder. No stone evidence of flow within the gallbladder wall to   suggest a mass.  Recommend clinical correlation. May consider HIDA scan,   particularly in the setting of positive sonographic Murphy's sign per   sonographer. Alternatively, recommend follow-up ultrasound in 2 weeks for   comparison.   2. Otherwise, unremarkable exam.              IR Results:   No results found for this or any previous visit.      VAS/US Results:   No results found for this or any previous visit.         Total discharge time 40 minutes.         Perfecto Kingdom, MD   [facility]   Sep 08, 2017   12:04 PM

## 2017-09-08 NOTE — Other (Addendum)
----------  DocumentID: [id number]------------------------------------------------              [facility]                       Patient Education Report         Name: Alyssa Lowe, Alyssa Lowe                  Date: 09/06/2017    MRN: [id number]                    Time: 9:18:58 PM         Patient ordered video: 'Patient Safety: Stay Safe While you are in the Hospital'    from [id number] via phone number: [phone] at 9:18:58 PM    Description: This program outlines some of the precautions patients can take to ensure a speedy recovery without extra complications. The video emphasizes the importance of communicating with the healthcare team.    ----------DocumentID: [id number]------------------------------------------------                       [facility]          Patient Education Report - Discharge Summary        Date: 09/08/2017   Time: 2:08:20 PM   Name: Alyssa Lowe, Alyssa Lowe   MRN: [id number]      Account Number: [id number]      Education History:        Patient ordered video: 'Patient Safety: Stay Safe While you are in the Hospital' from [id number] on 09/06/2017 09:18:58 PM

## 2017-09-08 NOTE — Progress Notes (Signed)
Discharge Plan:   Home with family assistance and MD follow up. See Epic for follow up care and appointments as noted. Dc instructions per MD and dc RN to pt.    Discharge Date:     09/08/2017         Transportation: Family/     Follow up  Other, Phys, MD  Schedule an appointment as soon as possible for a visit  for follow up appointment: [facility] [employer]**  Patient can only remember the practice name and not the physician

## 2017-09-08 NOTE — Discharge Summary (Signed)
Discharge Summary           Patient ID:  Alyssa Lowe, 42 y.o., female  DOB: February 17, 1976    Admit Date: 09/06/2017  Discharge Date:  09/08/2017  Length of stay: 0 day(s)    PCP:  Other, Phys, MD    Chief Complaint   Patient presents with   ??? Diarrhea   ??? Fever   ??? Vomiting   ??? Chest Pain       Hospital Problems  Never Reviewed          Codes Class Noted POA    Hepatitis A ICD-10-CM: B15.9  ICD-9-CM: 070.1  09/06/2017 Unknown              Discharge Diagnosis:     1. Acute Hepatitis A  2. UTI present on admission - urine culture neg. Completed short course antibiotic  3. Elevated LFT,  4. hypothyroidism      Hospital course    Admitted to floor. Started on IV fluids and supportive treatment. Seen by gastroenterology - INR normal. No active GI intervention recommended. Anticipate transaminases will peak and begin to trend down soon with later peak in [place].   Patient is feeling better and requesting to be discharged home on 09/08/17        Discharge instructions:    Will need LFT, INR check in one week  And then monthly till normal - script provided to patient - she will follow up with her PCP  Discussed with patient to ensure proper hand hygiene and avoid alcohol for 8 weeks    Follow-up:  PCP at [facility] - one week      HPI on Admission (per admitting physician Dr.Jasarevic):  Alyssa Lowe is a 42 y.o. year old female who presents with    headache lastingseveral days. Last Thursday suddenly felt very tired and had a fever. Went to her PCP Thursday. Was tested for strep and flu with negative results. She has been taking ABX for sinus infection and reports fever of 103, feeling worse overall, diarrhea, sweating, dizziness, nausea. Past two days has been vomiting along with blurry vision. Stopped taking her antibiotic Sunday??"augmentin".    ??  RUQ US shows: 1. The gallbladder is contracted, demonstrating some irregular wall thickening.  The thickening of the gallbladder wall may be entirely related to  decompression  of the gallbladder. No stone evidence of flow within the gallbladder wall to  suggest a mass. Recommend clinical correlation. May consider HIDA scan,  particularly in the setting of positive sonographic Murphy's sign per  sonographer. Alternatively, recommend follow-up ultrasound in 2 weeks for  comparison.  2. Otherwise, unremarkable exam.        Condition at discharge:  stable    Disposition: home    Code status : full code    Consultants:    1. Dr. Jeanie Cooks - GI    Physical Exam on Discharge:  Visit Vitals  BP 108/68 (BP 1 Location: Left arm, BP Patient Position: Sitting)   Pulse 81   Temp 97.6 ??F (36.4 ??C)   Resp 20   Ht 5' 8" (1.727 m)   Wt 77.1 kg (169 lb 15.6 oz)   SpO2 99%   BMI 25.84 kg/m??     GEN - AAOx3, comfortable  HEENT - icteric, mucous membranes moist  Neck - supple, no JVD  Cardiac - RRR, S1, S2, no murmurs  Chest/Lungs - clear to auscultation without wheezes or rhonchi  Abdomen - soft,  mild  RUQ and epigastric tenderness  Extremities - no clubbing/ cyanosis/ edema  Neuro - CN 2-12 intact. No focal deficits. No motor or sensory deficit appreciated.   Skin - no rashes or lesions        Discharge medications :  Current Discharge Medication List      START taking these medications    Details   ondansetron (ZOFRAN ODT) 4 mg disintegrating tablet Take 1 Tab by mouth every eight (8) hours as needed for Nausea.  Qty: 20 Tab, Refills: 0         CONTINUE these medications which have NOT CHANGED    Details   levothyroxine (SYNTHROID) 25 mcg tablet Take 25 mcg by mouth Daily (before breakfast).      cetirizine (ZYRTEC) 10 mg tablet Take 10 mg by mouth daily.      docusate sodium (COLACE) 100 mg capsule Take 100 mg by mouth daily as needed for Constipation.                 Most Recent Labs:  Recent Results (from the past 24 hour(s))   CBC WITH AUTOMATED DIFF    Collection Time: 09/08/17  3:58 AM   Result Value Ref Range    WBC 4.0 4.0 - 11.0 1000/mm3    NEUTROPHILS 43.8 34 - 64 %     LYMPHOCYTES 35.2 28 - 48 %    RBC 3.88 3.60 - 5.20 M/uL    MONOCYTES 6.7 1 - 13 %    HGB 11.9 (L) 13.0 - 17.2 gm/dl    EOSINOPHILS 2.9 0 - 5 %    HCT 36.7 (L) 37.0 - 50.0 %    MCV 94.6 80.0 - 98.0 fL    MCH 30.7 25.4 - 34.6 pg    MCHC 32.4 30.0 - 36.0 gm/dl    PLATELET 153 140 - 450 1000/mm3    MPV 11.2 (H) 6.0 - 10.0 fL    ATYPICAL LYMPHS 11.4 (H) 0 - 0 %    RDW-SD 44.8 36.4 - 46.3      RBC Morphology NORMAL      PLATELET COMMENTS NORMAL     METABOLIC PANEL, COMPREHENSIVE    Collection Time: 09/08/17  3:58 AM   Result Value Ref Range    Sodium 138 136 - 145 mEq/L    Potassium 4.0 3.5 - 5.1 mEq/L    Chloride 107 98 - 107 mEq/L    CO2 26 21 - 32 mEq/L    Glucose 123 (H) 74 - 106 mg/dl    BUN 6 (L) 7 - 25 mg/dl    Creatinine 0.6 0.6 - 1.3 mg/dl    GFR est AA >60.0      GFR est non-AA >60      Calcium 8.1 (L) 8.5 - 10.1 mg/dl    AST (SGOT) 2,492 (H) 15 - 37 U/L    ALT (SGPT) 4,554 (H) 12 - 78 U/L    Alk. phosphatase 233 (H) 45 - 117 U/L    Bilirubin, total 3.6 (H) 0.2 - 1.0 mg/dl    Protein, total 6.5 6.4 - 8.2 gm/dl    Albumin 2.8 (L) 3.4 - 5.0 gm/dl    Anion gap 5 5 - 15 mmol/L   PROTHROMBIN TIME + INR    Collection Time: 09/08/17  3:58 AM   Result Value Ref Range    Prothrombin time 14.4 (H) 10.2 - 12.9 seconds    INR 1.2 (H) 0.1 - 1.1  XR Results:  Results from Hospital Encounter encounter on 09/06/17   XR CHEST PA LAT    Narrative EXAM: Frontal and lateral views of the chest.    INDICATIONS: Fever.    COMPARISON: None.    FINDINGS: No alveolar consolidations, congestive changes or pleural effusions.  Cardiomediastinal silhouette normal in size and contour.      Impression IMPRESSION: No acute cardiopulmonary process.         CT Results:  No results found for this or any previous visit.    MRI Results:  No results found for this or any previous visit.    Nuclear Medicine Results:  No results found for this or any previous visit.    [place] Results:  Results from [place] encounter on 09/06/17    [place] RUQ    Narrative Examination: Ultrasound right upper quadrant.    INDICATION: Right upper quadrant pain. Elevated LFTs.    FINDINGS:    Liver appears unremarkable. Normal hepatic parenchyma. No focal hepatic lesions.  No significant intrahepatic biliary ductal dilatation. CBD within normal limits  roughly measuring 0.4 cm.    The gallbladder is contracted. There is mild irregular thickening of the  gallbladder wall. No significant surrounding inflammatory change. Portions of  the gallbladder wall measure up to 0.7 cm in thickness. There is evidence of  sonographic Murphy's sign per sonographer.    Visualized pancreas grossly unremarkable.    Proximal aorta and IVC grossly unremarkable.    The right kidney is normal in size and appears unremarkable.      Impression IMPRESSION:  1. The gallbladder is contracted, demonstrating some irregular wall thickening.  The thickening of the gallbladder wall may be entirely related to decompression  of the gallbladder. No stone evidence of flow within the gallbladder wall to  suggest a mass. Recommend clinical correlation. May consider HIDA scan,  particularly in the setting of positive sonographic Murphy's sign per  sonographer. Alternatively, recommend follow-up ultrasound in 2 weeks for  comparison.  2. Otherwise, unremarkable exam.         IR Results:  No results found for this or any previous visit.    VAS/US Results:  No results found for this or any previous visit.      Total discharge time 40 minutes.      Perfecto Kingdom, MD  [facility]  Sep 08, 2017  12:04 PM

## 2017-09-08 NOTE — Progress Notes (Signed)
0700--Bedside shift change report given to M. Lorella Nimrod, RN (oncoming nurse) by Mosetta Anis, RN (offgoing nurse). Report included the following information SBAR, Kardex, MAR and Recent Results.     1415--Pt d/c to home with family via WC. Reviewed prescriptions, F/u MD appts, and d/c instructions with pt. Copy of d/c paperwork received and prescriptions for lab work with pt.

## 2017-09-08 NOTE — Progress Notes (Signed)
Problem: Falls - Risk of  Goal: *Absence of Falls  Description  Document Schmid Fall Risk and appropriate interventions in the flowsheet.  Outcome: Progressing Towards Goal  Note:   Fall Risk Interventions:            Medication Interventions: Patient to call before getting OOB

## 2017-09-12 LAB — CULTURE, BLOOD
Blood Culture Result: NO GROWTH
Blood Culture Result: NO GROWTH

## 2017-12-11 ENCOUNTER — Inpatient Hospital Stay
Admit: 2017-12-11 | Discharge: 2017-12-11 | Disposition: A | Discharge status: Home / Self Care | Payer: TRICARE (CHAMPUS) | Attending: Emergency Medicine

## 2017-12-11 DIAGNOSIS — M79605 Pain in left leg: Secondary | ICD-10-CM

## 2017-12-11 MED ORDER — DICYCLOMINE 20 MG TAB
20 mg | ORAL_TABLET | Freq: Four times a day (QID) | ORAL | 0 refills | Status: AC | PRN
Start: 2017-12-11 — End: ?

## 2017-12-11 MED ORDER — SUCRALFATE 1 GRAM TAB
1 gram | ORAL_TABLET | Freq: Four times a day (QID) | ORAL | 0 refills | Status: AC
Start: 2017-12-11 — End: ?

## 2017-12-11 MED ORDER — ONDANSETRON HCL 4 MG TAB
4 mg | ORAL_TABLET | Freq: Four times a day (QID) | ORAL | 0 refills | Status: AC | PRN
Start: 2017-12-11 — End: ?

## 2017-12-11 MED ORDER — NAPROXEN 500 MG TAB
500 mg | ORAL_TABLET | Freq: Two times a day (BID) | ORAL | 0 refills | Status: AC
Start: 2017-12-11 — End: 2017-12-21

## 2017-12-11 MED ORDER — METHOCARBAMOL 750 MG TAB
750 mg | ORAL_TABLET | Freq: Four times a day (QID) | ORAL | 0 refills | Status: AC
Start: 2017-12-11 — End: 2017-12-16

## 2017-12-11 NOTE — ED Notes (Signed)
C/o left leg pain x 3weeks. R/o blood clot

## 2017-12-11 NOTE — ED Provider Notes (Signed)
ED Provider Notes by Towana Badger, PA at 12/11/17 1651                Author: Towana Badger, PA  Service: EMERGENCY  Author Type: Physician Assistant       Filed: 12/11/17 1802  Date of Service: 12/11/17 1651  Status: Attested           Editor: Adelfa Lozito, Laverta Taft [place], PA (Physician Assistant)  Cosigner: Konrad Felix, MD at 12/11/17 1810          Attestation signed by Konrad Felix, MD at 12/11/17 1810          I, Dr. Haze Justin, have personally seen and examined this patient; I have fully participated in the care of this patient with the advanced practice provider.   I have reviewed and agree with all pertinent clinical information including history, physical exam,   vascular studies and the plan.  I have also reviewed and agree with the medications, allergies and past medical history sections for this patient. Patient  presents with complaints left leg discomfort, tenderness in the left posterior calf as well as left medial thigh, PVL obtained negative on exam full range of motion and no palpable abnormalities will prescribe anti-inflammatories and muscle relaxers recommend  follow-up.    Konrad Felix, MD                                 [facility]   Emergency Department Treatment Report                Patient: Alyssa Lowe  Age: 42 y.o.  Sex: female          Date of Birth: 1975-12-25  Admit Date: 12/11/2017  PCP: Sol Blazing, MD     MRN: [id number]   CSN: [id number]   Attending: Konrad Felix, MD         Room: 101/EO01  Time Dictated: 4:51 PM  APP: Marcelyn Bruins, PA-C        Chief Complaint      Chief Complaint       Patient presents with        ?  Leg Pain                  History of Present Illness     42 y.o. female  who presents for evaluation of left leg pain.  Patient reports for about 3 weeks she has been experiencing intermittent inner thigh pain, that radiates down to her calf. The pain is described as "pulling", that moves up and down her entire leg.   [facility] and given Naprosyn and Robaxin. She has tried naprosyn with minimal relief.  Patient states over the weekend she noticed swelling in her ankle and calf.  She elevated her leg, and the swelling is decreased.  She also  reports an episode of warmth and redness on a separate occasion, to her inner thigh over the weekend.  Patient reports family history of blood clots.  Her maternal grandmother passed away from DVT at age 52. She denies chest pain, cough, shortness of  breath, or any other symptoms.        Review of Systems     Review of Systems    Constitutional: Negative for chills and fever.    HENT: Negative for congestion.  Eyes: Negative for visual disturbance.    Respiratory: Negative for shortness of breath.     Cardiovascular: Positive for leg swelling. Negative for chest pain and palpitations.    Gastrointestinal: Negative for abdominal pain, constipation, diarrhea, nausea and vomiting.    Genitourinary: Negative for dysuria.    Musculoskeletal: Negative for arthralgias.    Skin: Positive for color change (left thigh, and calf) . Negative for rash.    Allergic/Immunologic: Negative for environmental allergies.    Neurological: Negative for weakness, numbness and headaches.             Past Medical/Surgical History          Past Medical History:        Diagnosis  Date         ?  Thyroid disease          No past surgical history on file.        Social History          Social History          Socioeconomic History         ?  Marital status:  MARRIED              Spouse name:  Not on file         ?  Number of children:  Not on file     ?  Years of education:  Not on file     ?  Highest education level:  Not on file       Occupational History        ?  Not on file       Social Needs         ?  Financial resource strain:  Not on file        ?  Food insecurity:              Worry:  Not on file         Inability:  Not on file        ?  Transportation needs:               Medical:  Not on file         Non-medical:  Not on file       Tobacco Use         ?  Smoking status:  Not on file       Substance and Sexual Activity         ?  Alcohol use:  Not on file     ?  Drug use:  Not on file     ?  Sexual activity:  Not on file       Lifestyle        ?  Physical activity:              Days per week:  Not on file         Minutes per session:  Not on file         ?  Stress:  Not on file       Relationships        ?  Social connections:              Talks on phone:  Not on file         Gets together:  Not on file         Attends religious service:  Not on file  Active member of club or organization:  Not on file         Attends meetings of clubs or organizations:  Not on file         Relationship status:  Not on file        ?  Intimate partner violence:              Fear of current or ex partner:  Not on file         Emotionally abused:  Not on file         Physically abused:  Not on file         Forced sexual activity:  Not on file        Other Topics  Concern        ?  Not on file       Social History Narrative        ?  Not on file             Family History          Family History         Problem  Relation  Age of Onset          ?  Deep Vein Thrombosis  Maternal Grandmother               Current Medications        (Not in a hospital admission)        Allergies     No Known Allergies        Physical Exam          ED Triage Vitals [12/11/17 1535]     ED Encounter Vitals Group           BP  120/81        Pulse (Heart Rate)  78        Resp Rate  18        Temp  98.4 ??F (36.9 ??C)        Temp src          O2 Sat (%)  100 %        Weight             Height             Constitutional: Patient appears well developed and well nourished. Appears nontoxic and in no distress.    HENT: Normal cephalic and atraumatic.   Mucous membranes moist, non-erythematous and without lesions. Uvula is midline.   Eyes: Conjunctivae are clear with no discharge.   Neck: Normal ROM and non tender. Supple. No  nuchal rigidity.    Respiratory: Breath sounds are equal bilaterally. Lungs clear to auscultation with nonlabored respirations. No tachypnea or accessory muscle  use.   Cardiovascular: Normal rate and rhythm.  Distal pulses 2+ and equal bilaterally. Calves soft and non-tender.   Gastrointestinal: Bowel sounds normal. Abdomen soft and without complaint of pain to palpation. No distention. No masses palpable.    Musculoskeletal: Moves all extremities well. No lower extremity edema.    Lymphatic: No cervical adenopathy.   Integumentary: Warm and dry without rashes or erythema.   Neurologic: Alert and oriented. No facial asymmetry or dysarthria.   Psychological: Behavior is age and situation appropriate. No difficulty with memory.          Impression and Management Plan     This is a 42 y.o.  female with complaint of left leg  pain. Exam benign.  Will assess for comorbid complications that may be contributing to symptoms.  Appropriate diagnostic studies will be obtained and symptomatic treatment  given. Patient has family history of blood clots.  We will PVLs bilaterally to rule out clot.        Diagnostic Studies     Lab:    No results found for this or any previous visit (from the past 24 hour(s)).   Labs Reviewed - No data to display      Imaging:     No results found.        ED Course/Medical Decision Making         Patient remained clinically stable throughout the Emergency Department visit.  Vitals were unremarkable. PVL negative, no evidence of DVT. The patient is stable for outpatient management  with appropriate symptomatic treatment, return precautions, and was advised of the importance of outpatient follow up for ongoing care. Patient instructed to return to ED immediately if worsens.       All results reviewed and discussed with the patient.   Patient informed of and agrees with plan of care. All questions answered.        Final Diagnosis                 ICD-10-CM  ICD-9-CM          1.  Left leg pain  M79.605   729.5             Disposition     Discharged in stable condition.        Current Discharge Medication List              START taking these medications          Details        sucralfate (CARAFATE) 1 gram tablet  Take 1 Tab by mouth four (4) times daily. Prior to meals and at bedtime   Qty: 30 Tab, Refills:  0               dicyclomine (BENTYL) 20 mg tablet  Take 1 Tab by mouth every six (6) hours as needed (abdominal cramps) for up to 20 doses.   Qty: 20 Tab, Refills:  0               ondansetron hcl (ZOFRAN) 4 mg tablet  Take 1 Tab by mouth every six (6) hours as needed for Nausea.   Qty: 15 Tab, Refills:  0               naproxen (NAPROSYN) 500 mg tablet  Take 1 Tab by mouth two (2) times daily (with meals) for 10 days.   Qty: 20 Tab, Refills:  0               methocarbamol (ROBAXIN-750) 750 mg tablet  Take 1 Tab by mouth four (4) times daily for 5 days.   Qty: 20 Tab, Refills:  0                         Patient has been evaluated by myself and SIEGEL, LEWIS H, MD who agrees with the above assessment and plan.      Andreus Cure, PA-C   December 11, 2017   4:51 PM         My signature above authenticates this document and my orders, the final diagnosis (es), discharge prescription (s), and instructions in the Epic record.  If you have any questions please contact [phone].       Nursing notes have been reviewed by the physician/ advanced practice     Clinician.

## 2017-12-11 NOTE — Progress Notes (Signed)
Preliminary report    Bilateral lower extremity venous duplex exam:    1. No evidence of deep or superficial venous trombosis in the bilateral lower extremities.     Full report to follow.    Milena Handarova, RVT

## 2017-12-11 NOTE — ED Provider Notes (Signed)
[facility]  Emergency Department Treatment Report        Patient: Alyssa RogerChristi Lowe Age: 42 y.o. Sex: female    Date of Birth: [birth year] Admit Date: 12/11/2017 PCP: Sol Blazingther, Phys, MD   MRN: [id number]  CS: [id number]: [id number]  Attending: Konrad FelixSIEGEL, LEWIS H, MD   Room: 101/EO01 Time Dictated: 4:51 PM APP: Marcelyn Bruinseniece Placido Hangartner, PA-C     Chief Complaint   Chief Complaint   Patient presents with   ??? Leg Pain            History of Present Illness   42 y.o. female who presents for evaluation of left leg pain.  Patient reports for about 3 weeks she has been experiencing intermittent inner thigh pain, that radiates down to her calf. The pain is described as "pulling", that moves up and down her entire leg.  [facility] and given Naprosyn and Robaxin. She has tried naprosyn with minimal relief.  Patient states over the weekend she noticed swelling in her ankle and calf.  She elevated her leg, and the swelling is decreased.  She also reports an episode of warmth and redness on a separate occasion, to her inner thigh over the weekend.  Patient reports family history of blood clots.  Her maternal grandmother passed away from DVT at age 40. She denies chest pain, cough, shortness of breath, or any other symptoms.    Review of Systems   Review of Systems   Constitutional: Negative for chills and fever.   HENT: Negative for congestion.    Eyes: Negative for visual disturbance.   Respiratory: Negative for shortness of breath.    Cardiovascular: Positive for leg swelling. Negative for chest pain and palpitations.   Gastrointestinal: Negative for abdominal pain, constipation, diarrhea, nausea and vomiting.   Genitourinary: Negative for dysuria.   Musculoskeletal: Negative for arthralgias.   Skin: Positive for color change (left thigh, and calf). Negative for rash.   Allergic/Immunologic: Negative for environmental allergies.   Neurological: Negative for weakness, numbness and headaches.         Past Medical/Surgical History     Past Medical History:   Diagnosis Date   ??? Thyroid disease      No past surgical history on file.    Social History     Social History     Socioeconomic History   ??? Marital status: MARRIED     Spouse name: Not on file   ??? Number of children: Not on file   ??? Years of education: Not on file   ??? Highest education level: Not on file   Occupational History   ??? Not on file   Social Needs   ??? Financial resource strain: Not on file   ??? Food insecurity:     Worry: Not on file     Inability: Not on file   ??? Transportation needs:     Medical: Not on file     Non-medical: Not on file   Tobacco Use   ??? Smoking status: Not on file   Substance and Sexual Activity   ??? Alcohol use: Not on file   ??? Drug use: Not on file   ??? Sexual activity: Not on file   Lifestyle   ??? Physical activity:     Days per week: Not on file     Minutes per session: Not on file   ??? Stress: Not on file   Relationships   ??? Social connections:  Talks on phone: Not on file     Gets together: Not on file     Attends religious service: Not on file     Active member of club or organization: Not on file     Attends meetings of clubs or organizations: Not on file     Relationship status: Not on file   ??? Intimate partner violence:     Fear of current or ex partner: Not on file     Emotionally abused: Not on file     Physically abused: Not on file     Forced sexual activity: Not on file   Other Topics Concern   ??? Not on file   Social History Narrative   ??? Not on file       Family History     Family History   Problem Relation Age of Onset   ??? Deep Vein Thrombosis Maternal Grandmother        Current Medications     (Not in a hospital admission)    Allergies   No Known Allergies    Physical Exam     ED Triage Vitals [12/11/17 1535]   ED Encounter Vitals Group      BP 120/81      Pulse (Heart Rate) 78      Resp Rate 18      Temp 98.4 ??F (36.9 ??C)      Temp src       O2 Sat (%) 100 %      Weight       Height         Constitutional: Patient appears well developed and well nourished. Appears nontoxic and in no distress.   HENT: Normal cephalic and atraumatic.  Mucous membranes moist, non-erythematous and without lesions. Uvula is midline.  Eyes: Conjunctivae are clear with no discharge.  Neck: Normal ROM and non tender. Supple. No nuchal rigidity.   Respiratory: Breath sounds are equal bilaterally. Lungs clear to auscultation with nonlabored respirations. No tachypnea or accessory muscle use.  Cardiovascular: Normal rate and rhythm.  Distal pulses 2+ and equal bilaterally. Calves soft and non-tender.  Gastrointestinal: Bowel sounds normal. Abdomen soft and without complaint of pain to palpation. No distention. No masses palpable.   Musculoskeletal: Moves all extremities well. No lower extremity edema.   Lymphatic: No cervical adenopathy.  Integumentary: Warm and dry without rashes or erythema.  Neurologic: Alert and oriented. No facial asymmetry or dysarthria.  Psychological: Behavior is age and situation appropriate. No difficulty with memory.      Impression and Management Plan   This is a 42 y.o. female with complaint of left leg pain. Exam benign.  Will assess for comorbid complications that may be contributing to symptoms.  Appropriate diagnostic studies will be obtained and symptomatic treatment given. Patient has family history of blood clots.  We will PVLs bilaterally to rule out clot.    Diagnostic Studies   Lab:   No results found for this or any previous visit (from the past 24 hour(s)).  Labs Reviewed - No data to display    Imaging:    No results found.    ED Course/Medical Decision Making      Patient remained clinically stable throughout the Emergency Department visit.  Vitals were unremarkable. PVL negative, no evidence of DVT. The patient is stable for outpatient management with appropriate symptomatic treatment, return precautions, and was advised of the importance of  outpatient follow up for ongoing care. Patient instructed  to return to ED immediately if worsens.     All results reviewed and discussed with the patient.  Patient informed of and agrees with plan of care. All questions answered.    Final Diagnosis       ICD-10-CM ICD-9-CM   1. Left leg pain M79.605 729.5       Disposition   Discharged in stable condition.    Current Discharge Medication List      START taking these medications    Details   sucralfate (CARAFATE) 1 gram tablet Take 1 Tab by mouth four (4) times daily. Prior to meals and at bedtime  Qty: 30 Tab, Refills: 0      dicyclomine (BENTYL) 20 mg tablet Take 1 Tab by mouth every six (6) hours as needed (abdominal cramps) for up to 20 doses.  Qty: 20 Tab, Refills: 0      ondansetron hcl (ZOFRAN) 4 mg tablet Take 1 Tab by mouth every six (6) hours as needed for Nausea.  Qty: 15 Tab, Refills: 0      naproxen (NAPROSYN) 500 mg tablet Take 1 Tab by mouth two (2) times daily (with meals) for 10 days.  Qty: 20 Tab, Refills: 0      methocarbamol (ROBAXIN-750) 750 mg tablet Take 1 Tab by mouth four (4) times daily for 5 days.  Qty: 20 Tab, Refills: 0             Patient has been evaluated by myself and SIEGEL, LEWIS H, MD who agrees with the above assessment and plan.    Ritu Gagliardo, PA-C  December 11, 2017  4:51 PM      My signature above authenticates this document and my orders, the final diagnosis (es), discharge prescription (s), and instructions in the Epic record.  If you have any questions please contact [phone].     Nursing notes have been reviewed by the physician/ advanced practice    Clinician.

## 2017-12-11 NOTE — ED Triage Notes (Signed)
C/o left leg pain x 3weeks. R/o blood clot

## 2017-12-11 NOTE — Progress Notes (Signed)
Preliminary report    Bilateral lower extremity venous duplex exam:    1. No evidence of deep or superficial venous trombosis in the bilateral lower extremities.     Full report to follow.    Milena Handarova, RVT

## 2017-12-11 NOTE — Other (Signed)
Called patient because another ED patient called and stated she has [facility] Patient states she does have her prescriptions with her name on it for Alyssa Lowe and Alyssa Lowe.     Velia MeyerJessica Evgenia Merriman, [place] JerseyPA-C  12/11/2017  6:58PM

## 2018-06-03 ENCOUNTER — Emergency Department: Admit: 2018-06-03 | Payer: TRICARE (CHAMPUS) | Primary: Internal Medicine

## 2018-06-03 ENCOUNTER — Inpatient Hospital Stay
Admit: 2018-06-03 | Discharge: 2018-06-03 | Disposition: A | Discharge status: Home / Self Care | Payer: TRICARE (CHAMPUS) | Attending: Emergency Medicine

## 2018-06-03 DIAGNOSIS — N132 Hydronephrosis with renal and ureteral calculous obstruction: Secondary | ICD-10-CM

## 2018-06-03 LAB — CBC WITH AUTO DIFFERENTIAL
Basophils %: 0.7 % (ref 0–3)
Eosinophils %: 3 % (ref 0–5)
Hematocrit: 40.6 % (ref 37.0–50.0)
Hemoglobin: 13.5 gm/dl (ref 13.0–17.2)
Immature Granulocytes: 0.2 % (ref 0.0–3.0)
Lymphocytes %: 40.2 % (ref 28–48)
MCH: 31.5 pg (ref 25.4–34.6)
MCHC: 33.3 gm/dl (ref 30.0–36.0)
MCV: 94.9 fL (ref 80.0–98.0)
MPV: 10.1 fL — ABNORMAL HIGH (ref 6.0–10.0)
Monocytes %: 8.3 % (ref 1–13)
Neutrophils %: 47.6 % (ref 34–64)
Nucleated RBCs: 0 (ref 0–0)
Platelets: 251 10*3/uL (ref 140–450)
RBC: 4.28 M/uL (ref 3.60–5.20)
RDW-SD: 42.5 (ref 36.4–46.3)
WBC: 6 10*3/uL (ref 4.0–11.0)

## 2018-06-03 LAB — COMPREHENSIVE METABOLIC PANEL
ALT: 38 U/L (ref 12–78)
AST: 18 U/L (ref 15–37)
Albumin: 4.2 gm/dl (ref 3.4–5.0)
Alkaline Phosphatase: 62 U/L (ref 45–117)
Anion Gap: 6 mmol/L (ref 5–15)
BUN: 14 mg/dl (ref 7–25)
CO2: 26 mEq/L (ref 21–32)
Calcium: 9.4 mg/dl (ref 8.5–10.1)
Chloride: 108 mEq/L — ABNORMAL HIGH (ref 98–107)
Creatinine: 1 mg/dl (ref 0.6–1.3)
EGFR IF NonAfrican American: >60
GFR African American: >60
Glucose: 92 mg/dl (ref 74–106)
Potassium: 3.7 mEq/L (ref 3.5–5.1)
Sodium: 140 mEq/L (ref 136–145)
Total Bilirubin: 0.8 mg/dl (ref 0.2–1.0)
Total Protein: 8 gm/dl (ref 6.4–8.2)

## 2018-06-03 LAB — POC URINE MACROSCOPIC
Bilirubin, Urine: NEGATIVE
Bilirubin: NEGATIVE
Glucose, Ur: NEGATIVE mg/dl
Glucose: NEGATIVE mg/dl
Ketone: NEGATIVE mg/dl
Ketones, Urine: NEGATIVE mg/dl
Leukocyte Esterase, Urine: NEGATIVE
Leukocyte Esterase: NEGATIVE
Nitrite, Urine: NEGATIVE
Nitrites: NEGATIVE
Specific Gravity, UA: 1.015 (ref 1.005–1.030)
Specific gravity: 1.015 (ref 1.005–1.030)
Urobilinogen, UA, POCT: 0.2 EU/dl (ref 0.0–1.0)
Urobilinogen: 0.2 EU/dl (ref 0.0–1.0)
pH (UA): 8.5 (ref 5–9)
pH, UA: 8.5 (ref 5–9)

## 2018-06-03 LAB — POC HCG,URINE
HCG urine, QL: NEGATIVE
Pregnancy Test(Urn): NEGATIVE

## 2018-06-03 LAB — LIPASE
Lipase: 146 U/L (ref 73–393)
Lipase: 146 U/L (ref 73–393)

## 2018-06-03 LAB — METABOLIC PANEL, COMPREHENSIVE
ALT (SGPT): 38 U/L (ref 12–78)
AST (SGOT): 18 U/L (ref 15–37)
Albumin: 4.2 gm/dl (ref 3.4–5.0)
Alk. phosphatase: 62 U/L (ref 45–117)
Anion gap: 6 mmol/L (ref 5–15)
BUN: 14 mg/dl (ref 7–25)
Bilirubin, total: 0.8 mg/dl (ref 0.2–1.0)
CO2: 26 mEq/L (ref 21–32)
Calcium: 9.4 mg/dl (ref 8.5–10.1)
Chloride: 108 mEq/L — ABNORMAL HIGH (ref 98–107)
Creatinine: 1 mg/dl (ref 0.6–1.3)
GFR est AA: >60
GFR est non-AA: >60
Glucose: 92 mg/dl (ref 74–106)
Potassium: 3.7 mEq/L (ref 3.5–5.1)
Protein, total: 8 gm/dl (ref 6.4–8.2)
Sodium: 140 mEq/L (ref 136–145)

## 2018-06-03 LAB — CBC WITH AUTOMATED DIFF
BASOPHILS: 0.7 % (ref 0–3)
EOSINOPHILS: 3 % (ref 0–5)
HCT: 40.6 % (ref 37.0–50.0)
HGB: 13.5 gm/dl (ref 13.0–17.2)
IMMATURE GRANULOCYTES: 0.2 % (ref 0.0–3.0)
LYMPHOCYTES: 40.2 % (ref 28–48)
MCH: 31.5 pg (ref 25.4–34.6)
MCHC: 33.3 gm/dl (ref 30.0–36.0)
MCV: 94.9 fL (ref 80.0–98.0)
MONOCYTES: 8.3 % (ref 1–13)
MPV: 10.1 fL — ABNORMAL HIGH (ref 6.0–10.0)
NEUTROPHILS: 47.6 % (ref 34–64)
NRBC: 0 (ref 0–0)
PLATELET: 251 10*3/uL (ref 140–450)
RBC: 4.28 M/uL (ref 3.60–5.20)
RDW-SD: 42.5 (ref 36.4–46.3)
WBC: 6 10*3/uL (ref 4.0–11.0)

## 2018-06-03 MED ORDER — HYDROCODONE-ACETAMINOPHEN 5 MG-325 MG TAB
5-325 mg | ORAL_TABLET | ORAL | 0 refills | Status: AC | PRN
Start: 2018-06-03 — End: 2018-06-06

## 2018-06-03 MED ORDER — SODIUM CHLORIDE 0.9% BOLUS IV
0.9 % | INTRAVENOUS | Status: AC
Start: 2018-06-03 — End: 2018-06-03
  Administered 2018-06-03: 14:00:00 via INTRAVENOUS

## 2018-06-03 MED ORDER — MORPHINE 4 MG/ML SYRINGE
4 mg/mL | INTRAMUSCULAR | Status: AC
Start: 2018-06-03 — End: 2018-06-03
  Administered 2018-06-03: 14:00:00 via INTRAVENOUS

## 2018-06-03 MED ORDER — TAMSULOSIN SR 0.4 MG 24 HR CAP
0.4 mg | ORAL_CAPSULE | Freq: Every day | ORAL | 0 refills | Status: DC
Start: 2018-06-03 — End: 2018-06-03

## 2018-06-03 MED ORDER — HYDROCODONE-ACETAMINOPHEN 5 MG-325 MG TAB
5-325 mg | ORAL_TABLET | ORAL | 0 refills | Status: DC | PRN
Start: 2018-06-03 — End: 2018-06-03

## 2018-06-03 MED ORDER — TAMSULOSIN SR 0.4 MG 24 HR CAP
0.4 mg | ORAL | Status: AC
Start: 2018-06-03 — End: 2018-06-03
  Administered 2018-06-03: 14:00:00 via ORAL

## 2018-06-03 MED ORDER — HYDROCODONE-ACETAMINOPHEN 5 MG-325 MG TAB
5-325 mg | ORAL | Status: AC
Start: 2018-06-03 — End: 2018-06-03
  Administered 2018-06-03: 15:00:00 via ORAL

## 2018-06-03 MED ORDER — ONDANSETRON (PF) 4 MG/2 ML INJECTION
4 mg/2 mL | INTRAMUSCULAR | Status: AC
Start: 2018-06-03 — End: 2018-06-03
  Administered 2018-06-03: 14:00:00 via INTRAVENOUS

## 2018-06-03 MED ORDER — ONDANSETRON HCL 4 MG TAB
4 mg | ORAL_TABLET | Freq: Three times a day (TID) | ORAL | 0 refills | Status: AC | PRN
Start: 2018-06-03 — End: 2018-06-06

## 2018-06-03 MED ORDER — ONDANSETRON HCL 4 MG TAB
4 mg | ORAL_TABLET | Freq: Three times a day (TID) | ORAL | 0 refills | Status: DC | PRN
Start: 2018-06-03 — End: 2018-06-03

## 2018-06-03 MED ORDER — KETOROLAC TROMETHAMINE 30 MG/ML INJECTION
30 mg/mL (1 mL) | INTRAMUSCULAR | Status: AC
Start: 2018-06-03 — End: 2018-06-03
  Administered 2018-06-03: 14:00:00 via INTRAVENOUS

## 2018-06-03 MED ORDER — TAMSULOSIN SR 0.4 MG 24 HR CAP
0.4 mg | ORAL_CAPSULE | Freq: Every day | ORAL | 0 refills | Status: AC
Start: 2018-06-03 — End: 2018-06-13

## 2018-06-03 MED FILL — ONDANSETRON (PF) 4 MG/2 ML INJECTION: 4 mg/2 mL | INTRAMUSCULAR | Qty: 2

## 2018-06-03 MED FILL — MORPHINE 4 MG/ML SYRINGE: 4 mg/mL | INTRAMUSCULAR | Qty: 1

## 2018-06-03 MED FILL — KETOROLAC TROMETHAMINE 30 MG/ML INJECTION: 30 mg/mL (1 mL) | INTRAMUSCULAR | Qty: 1

## 2018-06-03 MED FILL — TAMSULOSIN SR 0.4 MG 24 HR CAP: 0.4 mg | ORAL | Qty: 1

## 2018-06-03 MED FILL — HYDROCODONE-ACETAMINOPHEN 5 MG-325 MG TAB: 5-325 mg | ORAL | Qty: 1

## 2018-06-03 NOTE — ED Provider Notes (Signed)
ED Provider Notes by Clerance Lav, [place] at 06/03/18 9030                Author: Clerance Lav, PA  Service: Emergency Medicine  Author Type: Physician Assistant       Filed: 06/03/18 1929  Date of Service: 06/03/18 0850  Status: Attested           Editor: Clerance Lav, [place] (Physician Assistant)  Cosigner: Lindajo Royal, MD at 06/04/18 [phone]          Attestation signed by Lindajo Royal, MD at 06/04/18 2319          Peterson Lombard, M.D., discussed with the mid-level provider and agree with the evaluation and plans as documented here.  I was available to address any questions  during Emergency Department evaluation.                                    [facility]   Emergency Department Treatment Report          Patient: Alyssa Lowe  Age: 43 y.o.  Sex: female          Date of Birth: 28-Sep-1975  Admit Date: 06/03/2018  PCP: Alveda Reasons, MD     MRN: [id number]   CSN: [id number]   MD: Lindajo Royal, MD         Room: ER35/ER35  Time Dictated: 8:50 AM  APP: Celestina Gironda           Chief Complaint    Abdominal/flank pain     History of Present Illness     43 y.o. female  with history of hypothyroidism presenting with acute onset sharp and stabbing left lower abdominal pain began at 5:30 AM.  States she felt a burning sensation initially and then started having stabbing left back pain.  States pain is better with moving  around.  She reports dysuria and has noticed spotting blood but believes she is on her menstrual cycle.  She has Mirena and menstrual cycle is irregular.  She has no history of kidney stones.  She is nauseous but no vomiting or fever.  Says she is shaking  due to the pain.  She also reports a small amount of loose stools this morning.  Denies vaginal discharge or concern for STDs        Review of Systems     Review of Systems    Constitutional: Negative for chills, diaphoresis and fever.    Eyes: Negative for redness.    Respiratory: Negative for shortness of breath.      Cardiovascular: Negative for chest pain.    Gastrointestinal: Positive for abdominal pain and diarrhea  (loose stool). Negative for nausea and vomiting.    Genitourinary: Positive for dysuria and flank pain . Negative for hematuria.    Musculoskeletal: Positive for back pain.    Skin: Negative for rash.    Neurological: Negative for sensory change, focal weakness, loss of consciousness and headaches.    Psychiatric/Behavioral: Negative for substance abuse.        All other systems reviewed and are negative.     Past Medical/Surgical History          Past Medical History:        Diagnosis  Date         ?  Thyroid disease  History reviewed. No pertinent surgical history.        Social History          Social History          Socioeconomic History         ?  Marital status:  MARRIED              Spouse name:  Not on file         ?  Number of children:  Not on file     ?  Years of education:  Not on file     ?  Highest education level:  Not on file       Tobacco Use         ?  Smoking status:  Current Every Day Smoker     ?  Smokeless tobacco:  Current User       Substance and Sexual Activity         ?  Alcohol use:  Yes             Comment: socially         ?  Drug use:  Not Currently     ?  Sexual activity:  Yes              Partners:  Male              Birth control/protection:  I.U.D.             Family History          Family History         Problem  Relation  Age of Onset          ?  Deep Vein Thrombosis  Maternal Grandmother               Current Medications          Current Outpatient Medications          Medication  Sig  Dispense  Refill           ?  HYDROcodone-acetaminophen (NORCO) 5-325 mg per tablet  Take 1 Tab by mouth every four (4) hours as needed for Pain for up to 3 days. Max Daily Amount: 6 Tabs.  8 Tab  0     ?  ondansetron hcl (ZOFRAN) 4 mg tablet  Take 1 Tab by mouth every eight (8) hours as needed for Nausea for up to 3 days.  12 Tab  0     ?  tamsulosin (FLOMAX) 0.4 mg capsule  Take 1 Cap  by mouth daily for 10 days.  10 Cap  0     ?  sucralfate (CARAFATE) 1 gram tablet  Take 1 Tab by mouth four (4) times daily. Prior to meals and at bedtime  30 Tab  0     ?  dicyclomine (BENTYL) 20 mg tablet  Take 1 Tab by mouth every six (6) hours as needed (abdominal cramps) for up to 20 doses.  20 Tab  0     ?  ondansetron hcl (ZOFRAN) 4 mg tablet  Take 1 Tab by mouth every six (6) hours as needed for Nausea.  15 Tab  0     ?  ondansetron (ZOFRAN ODT) 4 mg disintegrating tablet  Take 1 Tab by mouth every eight (8) hours as needed for Nausea.  20 Tab  0     ?  levothyroxine (SYNTHROID) 25 mcg tablet  Take 25  mcg by mouth Daily (before breakfast).         ?  cetirizine (ZYRTEC) 10 mg tablet  Take 10 mg by mouth daily.               ?  docusate sodium (COLACE) 100 mg capsule  Take 100 mg by mouth daily as needed for Constipation.                 Allergies     No Known Allergies        Physical Exam        Visit Vitals      BP  121/66 (BP 1 Location: Right arm, BP Patient Position: At rest)     Pulse  72     Temp  97.9 ??F (36.6 ??C)     Resp  18     Ht  5' 8" (1.727 m)     Wt  77.1 kg (170 lb)     SpO2  100%        BMI  25.85 kg/m??        Physical Exam   Vitals signs reviewed.   Constitutional:        Appearance: She is not diaphoretic.      Comments: Writhing in pain, bending over at bedside   HENT:       Head: Normocephalic and atraumatic.   Eyes :       Conjunctiva/sclera: Conjunctivae normal.      Pupils: Pupils are equal, round, and reactive to light.    Neck:       Musculoskeletal: Normal range of motion and neck supple.   Cardiovascular :       Rate and Rhythm: Normal rate and regular rhythm.      Heart sounds: Normal heart sounds.    Pulmonary:       Effort: Pulmonary effort is normal.      Breath sounds: Normal breath sounds. No wheezing.    Abdominal:      General: Bowel sounds are normal.      Palpations: Abdomen is soft.      Tenderness: There is abdominal tenderness  in the left lower quadrant. There is  left CVA tenderness . Negative signs include McBurney's sign.     Musculoskeletal: Normal range of motion.          General: No deformity.     Lymphadenopathy:       Cervical: No cervical adenopathy.   Skin :      General: Skin is warm and dry.      Findings: No rash.    Neurological:       Mental Status: She is alert and oriented to person, place, and time.      Gait: Gait is intact.    Psychiatric:         Cognition and Memory: Memory normal.               Impression and Management Plan     43 year old female presenting with acute onset of left back, flank and left lower quadrant abdominal pain.  She has CVA tenderness on left side as well as tenderness  to left lower quadrant.  Reports dysuria   Will obtain CBC, CMP, lipase, urine dip and pregnancy, give IV Toradol, morphine, Zofran and fluids   Obtain CT abd/pelvis without contrast        Diagnostic Studies     Lab:      Recent Results (  from the past 12 hour(s))     CBC WITH AUTOMATED DIFF          Collection Time: 06/03/18  8:34 AM         Result  Value  Ref Range            WBC  6.0  4.0 - 11.0 1000/mm3       RBC  4.28  3.60 - 5.20 M/uL       HGB  13.5  13.0 - 17.2 gm/dl       HCT  40.6  37.0 - 50.0 %       MCV  94.9  80.0 - 98.0 fL       MCH  31.5  25.4 - 34.6 pg       MCHC  33.3  30.0 - 36.0 gm/dl       PLATELET  251  140 - 450 1000/mm3       MPV  10.1 (H)  6.0 - 10.0 fL       RDW-SD  42.5  36.4 - 46.3         NRBC  0  0 - 0         IMMATURE GRANULOCYTES  0.2  0.0 - 3.0 %       NEUTROPHILS  47.6  34 - 64 %       LYMPHOCYTES  40.2  28 - 48 %       MONOCYTES  8.3  1 - 13 %       EOSINOPHILS  3.0  0 - 5 %       BASOPHILS  0.7  0 - 3 %       LIPASE          Collection Time: 06/03/18  8:34 AM         Result  Value  Ref Range            Lipase  146  73 - 393 U/L       METABOLIC PANEL, COMPREHENSIVE          Collection Time: 06/03/18  8:34 AM         Result  Value  Ref Range            Sodium  140  136 - 145 mEq/L       Potassium  3.7  3.5 - 5.1 mEq/L             Chloride  108 (H)  98 - 107 mEq/L            CO2  26  21 - 32 mEq/L       Glucose  92  74 - 106 mg/dl       BUN  14  7 - 25 mg/dl       Creatinine  1.0  0.6 - 1.3 mg/dl       GFR est AA  >60.0          GFR est non-AA  >60          Calcium  9.4  8.5 - 10.1 mg/dl       AST (SGOT)  18  15 - 37 U/L       ALT (SGPT)  38  12 - 78 U/L       Alk. phosphatase  62  45 - 117 U/L       Bilirubin, total  0.8  0.2 - 1.0 mg/dl  Protein, total  8.0  6.4 - 8.2 gm/dl       Albumin  4.2  3.4 - 5.0 gm/dl       Anion gap  6  5 - 15 mmol/L       POC URINE MACROSCOPIC          Collection Time: 06/03/18  8:41 AM         Result  Value  Ref Range            Glucose  Negative  NEGATIVE,Negative mg/dl       Bilirubin  Negative  NEGATIVE,Negative         Ketone  Negative  NEGATIVE,Negative mg/dl       Specific gravity  1.015  1.005 - 1.030         Blood  Moderate (A)  NEGATIVE,Negative         pH (UA)  8.5  5 - 9         Protein  Trace (A)  NEGATIVE,Negative mg/dl       Urobilinogen  0.2  0.0 - 1.0 EU/dl       Nitrites  Negative  NEGATIVE,Negative         Leukocyte Esterase  Negative  NEGATIVE,Negative         Color  Yellow          Appearance  Clear          POC HCG,URINE          Collection Time: 06/03/18  8:43 AM         Result  Value  Ref Range            HCG urine, QL  negative  NEGATIVE,Negative,negative             Imaging:     Ct Abd Pelv Wo Cont      Result Date: 06/03/2018   EXAM: CT ABD PELV WO CONT INDICATION: kidney stone-left flank pain, mild diarrhea    TECHNIQUE: Axial CT scan of the abdomen and pelvis without   IV contrast    including coronal and sagittal reconstructions.  DICOM format image data is available to non-affiliated  external healthcare facilities or entities on a secure, media free, reciprocally searchable basis with patient authorization for 12 months following the date of the study. COMPARISON: None FINDINGS: Lower thorax: No acute findings ABDOMEN: Liver: Unremarkable.  No mass. Gallbladder and bile ducts:  Unremarkable. No calcified stones. No ductal dilatation. Pancreas: Unremarkable. No ductal dilatation. No mass. Spleen: Unremarkable. No splenomegaly. Adrenals: 15 mm right adrenal nodule. Kidneys and ureters: Obstructing  3.6 mm stone at the left ureterovesical junction with mild left hydronephrosis and left hydroureter. Appendix: No findings to suggest appendicitis. Stomach and bowel: Stomach unremarkable. No obstruction. No mucosal thickening. No inflammatory changes.  Peritoneum: Unremarkable. No significant fluid collection. No free air. Lymph nodes: Unremarkable. No enlarged lymph nodes. Vasculature: Unremarkable. No aortic aneurysm. Bones: No acute fracture. Abdominal wall: Unremarkable. No evidence of a hernia.  PELVIS: Bladder: Unremarkable. No mass. Reproductive: IUD in good position.       IMPRESSION: 1. 3.6 mm obstructing stone at the left ureterovesical junction with mild left hydronephrosis. 2. 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 3. IUD in good position.            Medical Decision Making/ED Course     3.6 mm obstructing stone at left UVJ with mild left hydronephrosis.  She has normal white count and  kidney function, afebrile.  Her pain is controlled in ED and will  discharge with pain medication, Flomax and Zofran and have her follow-up with urology.  Return precautions given.  Will send urine for culture. Patient remained clinically stable throughout the Emergency Department visit.  Vitals were unremarkable and  the patient's condition required no further ED intervention.  The patient is stable for outpatient management with appropriate symptomatic treatment, return precautions, and was advised of the importance of outpatient follow up for ongoing care        Medications       morphine injection 4 mg (4 mg IntraVENous Given 06/03/18 0838)     ondansetron (ZOFRAN) injection 4 mg (4 mg IntraVENous Given 06/03/18 0838)     tamsulosin (FLOMAX) capsule 0.4 mg (0.4 mg Oral Given 06/03/18 0907)      ketorolac (TORADOL) injection 15 mg (15 mg IntraVENous Given 06/03/18 0907)     sodium chloride 0.9 % bolus infusion 1,000 mL (0 mL IntraVENous IV Completed 06/03/18 1027)       HYDROcodone-acetaminophen (NORCO) 5-325 mg per tablet 1 Tab (1 Tab Oral Given 06/03/18 1016)          Final Diagnosis                 ICD-10-CM  ICD-9-CM          1.  Kidney stone  N20.0  592.0          Disposition        home        Discharge Medication List as of 06/03/2018 10:03 AM              START taking these medications          Details        HYDROcodone-acetaminophen (NORCO) 5-325 mg per tablet  Take 1 Tab by mouth every four (4) hours as needed for Pain for up to 3 days. Max Daily Amount: 6 Tabs., Normal, Disp-8 Tab, R-0               !! ondansetron hcl (ZOFRAN) 4 mg tablet  Take 1 Tab by mouth every eight (8) hours as needed for Nausea for up to 3 days., Normal, Disp-12 Tab, R-0               tamsulosin (FLOMAX) 0.4 mg capsule  Take 1 Cap by mouth daily for 10 days., Normal, Disp-10 Cap, R-0               !! - Potential duplicate medications found. Please discuss with provider.              CONTINUE these medications which have NOT CHANGED          Details        sucralfate (CARAFATE) 1 gram tablet  Take 1 Tab by mouth four (4) times daily. Prior to meals and at bedtime, Print, Disp-30 Tab, R-0               dicyclomine (BENTYL) 20 mg tablet  Take 1 Tab by mouth every six (6) hours as needed (abdominal cramps) for up to 20 doses., Print, Disp-20 Tab, R-0               !! ondansetron hcl (ZOFRAN) 4 mg tablet  Take 1 Tab by mouth every six (6) hours as needed for Nausea., Print, Disp-15 Tab, R-0  ondansetron (ZOFRAN ODT) 4 mg disintegrating tablet  Take 1 Tab by mouth every eight (8) hours as needed for Nausea., Print, Disp-20 Tab, R-0               levothyroxine (SYNTHROID) 25 mcg tablet  Take 25 mcg by mouth Daily (before breakfast)., Historical Med               cetirizine (ZYRTEC) 10 mg tablet  Take 10 mg by mouth daily.,  Historical Med               docusate sodium (COLACE) 100 mg capsule  Take 100 mg by mouth daily as needed for Constipation., Historical Med               !! - Potential duplicate medications found. Please discuss with provider.               Patient was discharged home in stable condition with discharge instructions on the same.       Return to the ER if condition worsens or new symptoms develop.    Follow up with urology as discussed.       The patient was personally evaluated by myself and discussed with HUGHES, MARK C, MD who agrees with the above assessment and plan.      Clerance Lav, PA-C   June 03, 2018         *Portions of this electronic record were dictated using Systems analyst.  Unintended errors in translation may occur.      My signature above authenticates this document and my orders, the final ??   diagnosis (es), discharge prescription (s), and instructions in the Epic ??   record.   If you have any questions please contact [phone].   ??   Nursing notes have been reviewed by the physician/ advanced practice ??   Clinician.

## 2018-06-03 NOTE — ED Notes (Signed)
Discharge medications reviewed with patient and appropriate educational materials and side effects teaching were provided.

## 2018-06-03 NOTE — ED Notes (Signed)
Pt has sudden onset of left side abdomen and left flank pain this morning with nausea.

## 2018-06-03 NOTE — ED Notes (Signed)
Provided strainer to pt

## 2018-06-03 NOTE — ED Notes (Signed)
Assisted pt to bathroom

## 2018-06-03 NOTE — ED Notes (Signed)
Verbal order for zofran 4mg  IVP  and morphine 4mg  IVP for pain

## 2018-06-03 NOTE — ED Triage Notes (Signed)
Pt has sudden onset of left side abdomen and left flank pain this morning with nausea.

## 2018-06-03 NOTE — ED Notes (Signed)
Verbal order for zofran 4mg IVP  and morphine 4mg IVP for pain

## 2018-06-03 NOTE — ED Notes (Signed)
Provided strainer to pt

## 2018-06-03 NOTE — ED Provider Notes (Signed)
[place]  Emergency Department Treatment Report    Patient: Alyssa Lowe Age: 43 y.o. Sex: female    Date of Birth: 1975/09/19 Admit Date: 06/03/2018 PCP: Alveda Reasons, MD   MRN: [id number]  CSN: [id number]  MD: Lindajo Royal, MD   Room: ER35/ER35 Time Dictated: 8:50 AM APP: Deveon Kisiel       Chief Complaint   Abdominal/flank pain  History of Present Illness   43 y.o. female with history of hypothyroidism presenting with acute onset sharp and stabbing left lower abdominal pain began at 5:30 AM.  States she felt a burning sensation initially and then started having stabbing left back pain.  States pain is better with moving around.  She reports dysuria and has noticed spotting blood but believes she is on her menstrual cycle.  She has Mirena and menstrual cycle is irregular.  She has no history of kidney stones.  She is nauseous but no vomiting or fever.  Says she is shaking due to the pain.  She also reports a small amount of loose stools this morning.  Denies vaginal discharge or concern for STDs    Review of Systems   Review of Systems   Constitutional: Negative for chills, diaphoresis and fever.   Eyes: Negative for redness.   Respiratory: Negative for shortness of breath.    Cardiovascular: Negative for chest pain.   Gastrointestinal: Positive for abdominal pain and diarrhea (loose stool). Negative for nausea and vomiting.   Genitourinary: Positive for dysuria and flank pain. Negative for hematuria.   Musculoskeletal: Positive for back pain.   Skin: Negative for rash.   Neurological: Negative for sensory change, focal weakness, loss of consciousness and headaches.   Psychiatric/Behavioral: Negative for substance abuse.      All other systems reviewed and are negative.  Past Medical/Surgical History     Past Medical History:   Diagnosis Date   ??? Thyroid disease      History reviewed. No pertinent surgical history.    Social History     Social History     Socioeconomic History    ??? Marital status: MARRIED     Spouse name: Not on file   ??? Number of children: Not on file   ??? Years of education: Not on file   ??? Highest education level: Not on file   Tobacco Use   ??? Smoking status: Current Every Day Smoker   ??? Smokeless tobacco: Current User   Substance and Sexual Activity   ??? Alcohol use: Yes     Comment: socially   ??? Drug use: Not Currently   ??? Sexual activity: Yes     Partners: Male     Birth control/protection: I.U.D.       Family History     Family History   Problem Relation Age of Onset   ??? Deep Vein Thrombosis Maternal Grandmother        Current Medications     Current Outpatient Medications   Medication Sig Dispense Refill   ??? HYDROcodone-acetaminophen (NORCO) 5-325 mg per tablet Take 1 Tab by mouth every four (4) hours as needed for Pain for up to 3 days. Max Daily Amount: 6 Tabs. 8 Tab 0   ??? ondansetron hcl (ZOFRAN) 4 mg tablet Take 1 Tab by mouth every eight (8) hours as needed for Nausea for up to 3 days. 12 Tab 0   ??? tamsulosin (FLOMAX) 0.4 mg capsule Take 1 Cap by mouth daily for 10 days. 10  Cap 0   ??? sucralfate (CARAFATE) 1 gram tablet Take 1 Tab by mouth four (4) times daily. Prior to meals and at bedtime 30 Tab 0   ??? dicyclomine (BENTYL) 20 mg tablet Take 1 Tab by mouth every six (6) hours as needed (abdominal cramps) for up to 20 doses. 20 Tab 0   ??? ondansetron hcl (ZOFRAN) 4 mg tablet Take 1 Tab by mouth every six (6) hours as needed for Nausea. 15 Tab 0   ??? ondansetron (ZOFRAN ODT) 4 mg disintegrating tablet Take 1 Tab by mouth every eight (8) hours as needed for Nausea. 20 Tab 0   ??? levothyroxine (SYNTHROID) 25 mcg tablet Take 25 mcg by mouth Daily (before breakfast).     ??? cetirizine (ZYRTEC) 10 mg tablet Take 10 mg by mouth daily.     ??? docusate sodium (COLACE) 100 mg capsule Take 100 mg by mouth daily as needed for Constipation.         Allergies   No Known Allergies    Physical Exam     Visit Vitals  BP 121/66 (BP 1 Location: Right arm, BP Patient Position: At rest)    Pulse 72   Temp 97.9 ??F (36.6 ??C)   Resp 18   Ht '5\' 8"'$  (1.727 m)   Wt 77.1 kg (170 lb)   SpO2 100%   BMI 25.85 kg/m??     Physical Exam  Vitals signs reviewed.   Constitutional:       Appearance: She is not diaphoretic.      Comments: Writhing in pain, bending over at bedside   HENT:      Head: Normocephalic and atraumatic.   Eyes:      Conjunctiva/sclera: Conjunctivae normal.      Pupils: Pupils are equal, round, and reactive to light.   Neck:      Musculoskeletal: Normal range of motion and neck supple.   Cardiovascular:      Rate and Rhythm: Normal rate and regular rhythm.      Heart sounds: Normal heart sounds.   Pulmonary:      Effort: Pulmonary effort is normal.      Breath sounds: Normal breath sounds. No wheezing.   Abdominal:      General: Bowel sounds are normal.      Palpations: Abdomen is soft.      Tenderness: There is abdominal tenderness in the left lower quadrant. There is left CVA tenderness. Negative signs include McBurney's sign.   Musculoskeletal: Normal range of motion.         General: No deformity.   Lymphadenopathy:      Cervical: No cervical adenopathy.   Skin:     General: Skin is warm and dry.      Findings: No rash.   Neurological:      Mental Status: She is alert and oriented to person, place, and time.      Gait: Gait is intact.   Psychiatric:         Cognition and Memory: Memory normal.         Impression and Management Plan   43 year old female presenting with acute onset of left back, flank and left lower quadrant abdominal pain.  She has CVA tenderness on left side as well as tenderness to left lower quadrant.  Reports dysuria  Will obtain CBC, CMP, lipase, urine dip and pregnancy, give IV Toradol, morphine, Zofran and fluids  Obtain CT abd/pelvis without contrast    Diagnostic Studies  Lab:   Recent Results (from the past 12 hour(s))   CBC WITH AUTOMATED DIFF    Collection Time: 06/03/18  8:34 AM   Result Value Ref Range    WBC 6.0 4.0 - 11.0 1000/mm3    RBC 4.28 3.60 - 5.20 M/uL     HGB 13.5 13.0 - 17.2 gm/dl    HCT 40.6 37.0 - 50.0 %    MCV 94.9 80.0 - 98.0 fL    MCH 31.5 25.4 - 34.6 pg    MCHC 33.3 30.0 - 36.0 gm/dl    PLATELET 251 140 - 450 1000/mm3    MPV 10.1 (H) 6.0 - 10.0 fL    RDW-SD 42.5 36.4 - 46.3      NRBC 0 0 - 0      IMMATURE GRANULOCYTES 0.2 0.0 - 3.0 %    NEUTROPHILS 47.6 34 - 64 %    LYMPHOCYTES 40.2 28 - 48 %    MONOCYTES 8.3 1 - 13 %    EOSINOPHILS 3.0 0 - 5 %    BASOPHILS 0.7 0 - 3 %   LIPASE    Collection Time: 06/03/18  8:34 AM   Result Value Ref Range    Lipase 146 73 - 166 U/L   METABOLIC PANEL, COMPREHENSIVE    Collection Time: 06/03/18  8:34 AM   Result Value Ref Range    Sodium 140 136 - 145 mEq/L    Potassium 3.7 3.5 - 5.1 mEq/L    Chloride 108 (H) 98 - 107 mEq/L    CO2 26 21 - 32 mEq/L    Glucose 92 74 - 106 mg/dl    BUN 14 7 - 25 mg/dl    Creatinine 1.0 0.6 - 1.3 mg/dl    GFR est AA >60.0      GFR est non-AA >60      Calcium 9.4 8.5 - 10.1 mg/dl    AST (SGOT) 18 15 - 37 U/L    ALT (SGPT) 38 12 - 78 U/L    Alk. phosphatase 62 45 - 117 U/L    Bilirubin, total 0.8 0.2 - 1.0 mg/dl    Protein, total 8.0 6.4 - 8.2 gm/dl    Albumin 4.2 3.4 - 5.0 gm/dl    Anion gap 6 5 - 15 mmol/L   POC URINE MACROSCOPIC    Collection Time: 06/03/18  8:41 AM   Result Value Ref Range    Glucose Negative NEGATIVE,Negative mg/dl    Bilirubin Negative NEGATIVE,Negative      Ketone Negative NEGATIVE,Negative mg/dl    Specific gravity 1.015 1.005 - 1.030      Blood Moderate (A) NEGATIVE,Negative      pH (UA) 8.5 5 - 9      Protein Trace (A) NEGATIVE,Negative mg/dl    Urobilinogen 0.2 0.0 - 1.0 EU/dl    Nitrites Negative NEGATIVE,Negative      Leukocyte Esterase Negative NEGATIVE,Negative      Color Yellow      Appearance Clear     POC HCG,URINE    Collection Time: 06/03/18  8:43 AM   Result Value Ref Range    HCG urine, QL negative NEGATIVE,Negative,negative         Imaging:    Ct Abd Pelv Wo Cont    Result Date: 06/03/2018  EXAM: CT ABD PELV WO CONT INDICATION: kidney stone-left flank pain, mild  diarrhea    TECHNIQUE: Axial CT scan of the abdomen and pelvis without   IV contrast  including coronal and sagittal reconstructions.  DICOM format image data is available to non-affiliated external healthcare facilities or entities on a secure, media free, reciprocally searchable basis with patient authorization for 12 months following the date of the study. COMPARISON: None FINDINGS: Lower thorax: No acute findings ABDOMEN: Liver: Unremarkable. No mass. Gallbladder and bile ducts: Unremarkable. No calcified stones. No ductal dilatation. Pancreas: Unremarkable. No ductal dilatation. No mass. Spleen: Unremarkable. No splenomegaly. Adrenals: 15 mm right adrenal nodule. Kidneys and ureters: Obstructing 3.6 mm stone at the left ureterovesical junction with mild left hydronephrosis and left hydroureter. Appendix: No findings to suggest appendicitis. Stomach and bowel: Stomach unremarkable. No obstruction. No mucosal thickening. No inflammatory changes. Peritoneum: Unremarkable. No significant fluid collection. No free air. Lymph nodes: Unremarkable. No enlarged lymph nodes. Vasculature: Unremarkable. No aortic aneurysm. Bones: No acute fracture. Abdominal wall: Unremarkable. No evidence of a hernia. PELVIS: Bladder: Unremarkable. No mass. Reproductive: IUD in good position.     IMPRESSION: 1. 3.6 mm obstructing stone at the left ureterovesical junction with mild left hydronephrosis. 2. 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 3. IUD in good position.       Medical Decision Making/ED Course   3.6 mm obstructing stone at left UVJ with mild left hydronephrosis.  She has normal white count and kidney function, afebrile.  Her pain is controlled in ED and will discharge with pain medication, Flomax and Zofran and have her follow-up with urology.  Return precautions given.  Will send urine for culture. Patient remained clinically stable throughout the Emergency Department visit.  Vitals were unremarkable and the  patient's condition required no further ED intervention.  The patient is stable for outpatient management with appropriate symptomatic treatment, return precautions, and was advised of the importance of outpatient follow up for ongoing care    Medications   morphine injection 4 mg (4 mg IntraVENous Given 06/03/18 0838)   ondansetron (ZOFRAN) injection 4 mg (4 mg IntraVENous Given 06/03/18 0838)   tamsulosin (FLOMAX) capsule 0.4 mg (0.4 mg Oral Given 06/03/18 0907)   ketorolac (TORADOL) injection 15 mg (15 mg IntraVENous Given 06/03/18 0907)   sodium chloride 0.9 % bolus infusion 1,000 mL (0 mL IntraVENous IV Completed 06/03/18 1027)   HYDROcodone-acetaminophen (NORCO) 5-325 mg per tablet 1 Tab (1 Tab Oral Given 06/03/18 1016)     Final Diagnosis       ICD-10-CM ICD-9-CM   1. Kidney stone N20.0 592.0     Disposition     home    Discharge Medication List as of 06/03/2018 10:03 AM      START taking these medications    Details   HYDROcodone-acetaminophen (NORCO) 5-325 mg per tablet Take 1 Tab by mouth every four (4) hours as needed for Pain for up to 3 days. Max Daily Amount: 6 Tabs., Normal, Disp-8 Tab, R-0      !! ondansetron hcl (ZOFRAN) 4 mg tablet Take 1 Tab by mouth every eight (8) hours as needed for Nausea for up to 3 days., Normal, Disp-12 Tab, R-0      tamsulosin (FLOMAX) 0.4 mg capsule Take 1 Cap by mouth daily for 10 days., Normal, Disp-10 Cap, R-0       !! - Potential duplicate medications found. Please discuss with provider.      CONTINUE these medications which have NOT CHANGED    Details   sucralfate (CARAFATE) 1 gram tablet Take 1 Tab by mouth four (4) times daily. Prior to meals and at bedtime, Print, Disp-30 Tab,  R-0      dicyclomine (BENTYL) 20 mg tablet Take 1 Tab by mouth every six (6) hours as needed (abdominal cramps) for up to 20 doses., Print, Disp-20 Tab, R-0      !! ondansetron hcl (ZOFRAN) 4 mg tablet Take 1 Tab by mouth every six (6) hours as needed for Nausea., Print, Disp-15 Tab, R-0       ondansetron (ZOFRAN ODT) 4 mg disintegrating tablet Take 1 Tab by mouth every eight (8) hours as needed for Nausea., Print, Disp-20 Tab, R-0      levothyroxine (SYNTHROID) 25 mcg tablet Take 25 mcg by mouth Daily (before breakfast)., Historical Med      cetirizine (ZYRTEC) 10 mg tablet Take 10 mg by mouth daily., Historical Med      docusate sodium (COLACE) 100 mg capsule Take 100 mg by mouth daily as needed for Constipation., Historical Med       !! - Potential duplicate medications found. Please discuss with provider.        Patient was discharged home in stable condition with discharge instructions on the same.     Return to the ER if condition worsens or new symptoms develop.   Follow up with urology as discussed.     The patient was personally evaluated by myself and discussed with HUGHES, MARK C, MD who agrees with the above assessment and plan.    Clerance Lav, PA-C  June 03, 2018      *Portions of this electronic record were dictated using Systems analyst.  Unintended errors in translation may occur.    My signature above authenticates this document and my orders, the final ??  diagnosis (es), discharge prescription (s), and instructions in the Epic ??  record.  If you have any questions please contact [phone].  ??  Nursing notes have been reviewed by the physician/ advanced practice ??  Clinician.

## 2018-06-04 LAB — CULTURE, URINE
CULTURE RESULT: NO GROWTH
Culture result: NO GROWTH

## 2018-06-13 NOTE — Progress Notes (Signed)
06-07-2018 Rcvd CC in-basket fr Jessica peoples, PA [facility]): 3.6 mm obstructing stone left UVJ, mild hydronephrosis. Pain controlled, afebrile with normal WBC and kidney function. [facility] ER . LM.-Myrna N    06-13-2018 No return call fr pt. Will send letter.-Myrna N

## 2018-06-13 NOTE — Progress Notes (Signed)
06-07-2018 Rcvd CC in-basket fr Jessica peoples, PA ([facility]): 3.6 mm obstructing stone left UVJ, mild hydronephrosis. Pain controlled, afebrile with normal WBC and kidney function. [facility] ER . LM.-Myrna N    06-13-2018 No return call fr pt. Will send letter.-Myrna N
